# Patient Record
Sex: Female | Born: 1985 | Hispanic: Yes | Marital: Single | Smoking: Never smoker
Health system: Southern US, Community
[De-identification: ages and names within clinical notes are randomized; demographics above are authoritative.]

---

## 2009-09-26 ENCOUNTER — Inpatient Hospital Stay (HOSPITAL_COMMUNITY): Admission: AD | Admit: 2009-09-26 | Discharge: 2009-09-28 | Payer: Self-pay | Admitting: Obstetrics and Gynecology

## 2010-03-15 ENCOUNTER — Emergency Department (HOSPITAL_BASED_OUTPATIENT_CLINIC_OR_DEPARTMENT_OTHER): Admission: EM | Admit: 2010-03-15 | Discharge: 2010-03-16 | Payer: Self-pay | Admitting: Emergency Medicine

## 2010-03-15 ENCOUNTER — Ambulatory Visit: Payer: Self-pay | Admitting: Diagnostic Radiology

## 2010-11-04 LAB — URINALYSIS, ROUTINE W REFLEX MICROSCOPIC
Bilirubin Urine: NEGATIVE
Hgb urine dipstick: NEGATIVE
Nitrite: NEGATIVE
pH: 5.5 (ref 5.0–8.0)

## 2010-11-04 LAB — PREGNANCY, URINE: Preg Test, Ur: NEGATIVE

## 2010-11-09 LAB — CBC
Hemoglobin: 11.8 g/dL — ABNORMAL LOW (ref 12.0–15.0)
Hemoglobin: 13.4 g/dL (ref 12.0–15.0)
MCHC: 34 g/dL (ref 30.0–36.0)
MCV: 96.1 fL (ref 78.0–100.0)
MCV: 96.2 fL (ref 78.0–100.0)
Platelets: 220 10*3/uL (ref 150–400)
RDW: 13 % (ref 11.5–15.5)

## 2010-11-09 LAB — RPR: RPR Ser Ql: NONREACTIVE

## 2014-05-21 ENCOUNTER — Encounter (HOSPITAL_COMMUNITY): Payer: Self-pay | Admitting: Emergency Medicine

## 2014-05-21 ENCOUNTER — Inpatient Hospital Stay (HOSPITAL_COMMUNITY)
Admission: EM | Admit: 2014-05-21 | Discharge: 2014-05-24 | DRG: 419 | Disposition: A | Discharge status: Home / Self Care | Payer: Self-pay | Attending: Surgery | Admitting: Surgery

## 2014-05-21 ENCOUNTER — Emergency Department (HOSPITAL_COMMUNITY): Payer: Self-pay

## 2014-05-21 DIAGNOSIS — K810 Acute cholecystitis: Secondary | ICD-10-CM

## 2014-05-21 DIAGNOSIS — K8000 Calculus of gallbladder with acute cholecystitis without obstruction: Principal | ICD-10-CM | POA: Diagnosis present

## 2014-05-21 DIAGNOSIS — Z79899 Other long term (current) drug therapy: Secondary | ICD-10-CM

## 2014-05-21 LAB — COMPREHENSIVE METABOLIC PANEL
ALT: 33 U/L (ref 0–35)
AST: 20 U/L (ref 0–37)
Albumin: 4 g/dL (ref 3.5–5.2)
Alkaline Phosphatase: 49 U/L (ref 39–117)
Anion gap: 14 (ref 5–15)
BILIRUBIN TOTAL: 1.6 mg/dL — AB (ref 0.3–1.2)
BUN: 11 mg/dL (ref 6–23)
CALCIUM: 9.1 mg/dL (ref 8.4–10.5)
CO2: 22 mEq/L (ref 19–32)
Chloride: 101 mEq/L (ref 96–112)
Creatinine, Ser: 0.69 mg/dL (ref 0.50–1.10)
GFR calc non Af Amer: >90 mL/min (ref >90)
GLUCOSE: 123 mg/dL — AB (ref 70–99)
Potassium: 3.7 mEq/L (ref 3.7–5.3)
Sodium: 137 mEq/L (ref 137–147)
TOTAL PROTEIN: 7.9 g/dL (ref 6.0–8.3)

## 2014-05-21 LAB — CBC WITH DIFFERENTIAL/PLATELET
BASOS ABS: 0 10*3/uL (ref 0.0–0.1)
BASOS PCT: 0 % (ref 0–1)
EOS ABS: 0 10*3/uL (ref 0.0–0.7)
Eosinophils Relative: 0 % (ref 0–5)
HCT: 37.5 % (ref 36.0–46.0)
Hemoglobin: 13.4 g/dL (ref 12.0–15.0)
LYMPHS PCT: 7 % — AB (ref 12–46)
Lymphs Abs: 1.1 10*3/uL (ref 0.7–4.0)
MCH: 31.5 pg (ref 26.0–34.0)
MCHC: 35.7 g/dL (ref 30.0–36.0)
MCV: 88 fL (ref 78.0–100.0)
MONOS PCT: 12 % (ref 3–12)
Monocytes Absolute: 2 10*3/uL — ABNORMAL HIGH (ref 0.1–1.0)
NEUTROS ABS: 12.6 10*3/uL — AB (ref 1.7–7.7)
NEUTROS PCT: 81 % — AB (ref 43–77)
Platelets: 278 10*3/uL (ref 150–400)
RBC: 4.26 MIL/uL (ref 3.87–5.11)
RDW: 12.5 % (ref 11.5–15.5)
WBC: 15.7 10*3/uL — AB (ref 4.0–10.5)

## 2014-05-21 LAB — POC URINE PREG, ED: PREG TEST UR: NEGATIVE

## 2014-05-21 LAB — URINE MICROSCOPIC-ADD ON

## 2014-05-21 LAB — URINALYSIS, ROUTINE W REFLEX MICROSCOPIC
Bilirubin Urine: NEGATIVE
Glucose, UA: NEGATIVE mg/dL
Ketones, ur: 40 mg/dL — AB
NITRITE: NEGATIVE
Protein, ur: NEGATIVE mg/dL
Specific Gravity, Urine: 1.005 (ref 1.005–1.030)
UROBILINOGEN UA: 0.2 mg/dL (ref 0.0–1.0)
pH: 6 (ref 5.0–8.0)

## 2014-05-21 LAB — LIPASE, BLOOD: Lipase: 15 U/L (ref 11–59)

## 2014-05-21 MED ORDER — MORPHINE SULFATE 2 MG/ML IJ SOLN
2.0000 mg | INTRAMUSCULAR | Status: DC | PRN
  Administered 2014-05-21: 2 mg via INTRAVENOUS
  Filled 2014-05-21: qty 2

## 2014-05-21 MED ORDER — SODIUM CHLORIDE 0.9 % IV SOLN
INTRAVENOUS | Status: DC
  Administered 2014-05-21 – 2014-05-22 (×4): via INTRAVENOUS
  Administered 2014-05-23: 100 mL/h via INTRAVENOUS
  Administered 2014-05-24: 01:00:00 via INTRAVENOUS

## 2014-05-21 MED ORDER — ONDANSETRON HCL 4 MG/2ML IJ SOLN
4.0000 mg | Freq: Once | INTRAMUSCULAR | Status: AC
  Administered 2014-05-21: 4 mg via INTRAVENOUS
  Filled 2014-05-21: qty 2

## 2014-05-21 MED ORDER — HYDROMORPHONE HCL 1 MG/ML IJ SOLN
1.0000 mg | Freq: Once | INTRAMUSCULAR | Status: AC
  Administered 2014-05-21: 1 mg via INTRAVENOUS
  Filled 2014-05-21: qty 1

## 2014-05-21 MED ORDER — CEFTRIAXONE SODIUM 2 G IJ SOLR
2.0000 g | INTRAMUSCULAR | Status: DC
  Administered 2014-05-21 – 2014-05-23 (×3): 2 g via INTRAVENOUS
  Filled 2014-05-21 (×4): qty 2

## 2014-05-21 MED ORDER — SODIUM CHLORIDE 0.9 % IV BOLUS (SEPSIS)
1000.0000 mL | Freq: Once | INTRAVENOUS | Status: AC
  Administered 2014-05-21: 1000 mL via INTRAVENOUS

## 2014-05-21 MED ORDER — ONDANSETRON HCL 4 MG/2ML IJ SOLN: 4.0000 mg | Freq: Four times a day (QID) | INTRAMUSCULAR | Status: DC | PRN

## 2014-05-21 MED ORDER — ENOXAPARIN SODIUM 40 MG/0.4ML ~~LOC~~ SOLN
40.0000 mg | SUBCUTANEOUS | Status: DC
  Administered 2014-05-21 – 2014-05-23 (×3): 40 mg via SUBCUTANEOUS
  Filled 2014-05-21 (×4): qty 0.4

## 2014-05-21 MED ORDER — ACETAMINOPHEN 650 MG RE SUPP: 650.0000 mg | Freq: Four times a day (QID) | RECTAL | Status: DC | PRN

## 2014-05-21 MED ORDER — MORPHINE SULFATE 4 MG/ML IJ SOLN
4.0000 mg | Freq: Once | INTRAMUSCULAR | Status: AC
  Administered 2014-05-21: 4 mg via INTRAVENOUS
  Filled 2014-05-21: qty 1

## 2014-05-21 MED ORDER — ACETAMINOPHEN 325 MG PO TABS
650.0000 mg | ORAL_TABLET | Freq: Four times a day (QID) | ORAL | Status: DC | PRN
  Administered 2014-05-22: 650 mg via ORAL
  Filled 2014-05-21: qty 2

## 2014-05-21 MED ORDER — HYDROCODONE-ACETAMINOPHEN 5-325 MG PO TABS
1.0000 | ORAL_TABLET | ORAL | Status: DC | PRN
  Administered 2014-05-22 – 2014-05-24 (×7): 2 via ORAL
  Filled 2014-05-21 (×7): qty 2

## 2014-05-21 NOTE — Progress Notes (Signed)
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

## 2014-05-21 NOTE — ED Notes (Signed)
Will get urine sample once Katherine Berry is done with her

## 2014-05-21 NOTE — H&P (Signed)
I saw the patient, participated in the history, exam and medical decision making, and concur with the physician assistant's note above.  Several days of epigastric and Rt sided pain. Some n and v. Subjective f/c. ?prior episodes years ago Alert, nontoxic cta b/l Soft, nd, +RUQ TTP, +murphy's sign. No rt/guarding/peritonitis  Acute calculous cholecystitis  Very large GS on u/s which may make Lap chole challenging and higher risk for open procedure. Admit  Clears, npo p mn IV abx for cholecystitis.  Tentative cholecystectomy Saturday by my partners on call this weekend; however, they will make final call regarding timing of surgery and whether or not addl testing needs to be done.  Repeat labs in am  Mary Sellaric M. Andrey CampanileWilson, MD, FACS General, Bariatric, & Minimally Invasive Surgery [facility], [place]

## 2014-05-21 NOTE — H&P (Signed)
Chief Complaint: acute calculous cholecystitis  HPI: Katherine Berry is a healthy 28 year old Hispanic female who presents with abdominal pain.  Duration of symptoms is 3 days. Coarse is worsening. Time pattern is constant.  Severe in severity.  Location is RUQ, epigastric region with radiation to the back.  Aggravated by food, moving.  No alleviating factors.  No modifying factors. Associated with nausea, vomiting, fevers, chills.  Denies diarrhea, constipation, melena, hematochezia or dysuria.  She has not other medical problems, no previous surgeries. She reports similar symptoms about 11 years ago.  Her work up shows a white count of 15.7K. Bilirubin of 1.6, normal AST and ALT.  [place] of abdomen showed a 2.47m CBD, 6cm gallstone, sludge, positive sonographic murphy's sign, impacted stone at the neck of the gallbladder.  We have therefore been asked to evaluate the patient.   History reviewed. No pertinent past medical history.  History reviewed. No pertinent past surgical history.  History reviewed. No pertinent family history. Social History:  reports that she has never smoked. She has never used smokeless tobacco. She reports that she does not drink alcohol or use illicit drugs.  Allergies: No Known Allergies   Medication Prior to Admission medications   Medication Sig Start Date End Date Taking? Authorizing Provider  metroNIDAZOLE (FLAGYL) 500 MG tablet Take 500 mg by mouth 3 (three) times daily.   Yes Historical Provider, MD  omeprazole (PRILOSEC) 40 MG capsule Take 40 mg by mouth daily.   Yes Historical Provider, MD  promethazine (PHENERGAN) 25 MG tablet Take 25 mg by mouth every 6 (six) hours as needed for nausea or vomiting.   Yes Historical Provider, MD     (Not in a hospital admission)  Results for orders placed during the hospital encounter of 05/21/14 (from the past 48 hour(s))  CBC WITH DIFFERENTIAL     Status: Abnormal   Collection Time    05/21/14  2:16 PM      Result Value  Ref Range   WBC 15.7 (*) 4.0 - 10.5 K/uL   RBC 4.26  3.87 - 5.11 MIL/uL   Hemoglobin 13.4  12.0 - 15.0 g/dL   HCT 37.5  36.0 - 46.0 %   MCV 88.0  78.0 - 100.0 fL   MCH 31.5  26.0 - 34.0 pg   MCHC 35.7  30.0 - 36.0 g/dL   RDW 12.5  11.5 - 15.5 %   Platelets 278  150 - 400 K/uL   Neutrophils Relative % 81 (*) 43 - 77 %   Neutro Abs 12.6 (*) 1.7 - 7.7 K/uL   Lymphocytes Relative 7 (*) 12 - 46 %   Lymphs Abs 1.1  0.7 - 4.0 K/uL   Monocytes Relative 12  3 - 12 %   Monocytes Absolute 2.0 (*) 0.1 - 1.0 K/uL   Eosinophils Relative 0  0 - 5 %   Eosinophils Absolute 0.0  0.0 - 0.7 K/uL   Basophils Relative 0  0 - 1 %   Basophils Absolute 0.0  0.0 - 0.1 K/uL  COMPREHENSIVE METABOLIC PANEL     Status: Abnormal   Collection Time    05/21/14  2:16 PM      Result Value Ref Range   Sodium 137  137 - 147 mEq/L   Potassium 3.7  3.7 - 5.3 mEq/L   Chloride 101  96 - 112 mEq/L   CO2 22  19 - 32 mEq/L   Glucose, Bld 123 (*) 70 - 99 mg/dL  BUN 11  6 - 23 mg/dL   Creatinine, Ser 0.69  0.50 - 1.10 mg/dL   Calcium 9.1  8.4 - 10.5 mg/dL   Total Protein 7.9  6.0 - 8.3 g/dL   Albumin 4.0  3.5 - 5.2 g/dL   AST 20  0 - 37 U/L   ALT 33  0 - 35 U/L   Alkaline Phosphatase 49  39 - 117 U/L   Total Bilirubin 1.6 (*) 0.3 - 1.2 mg/dL   GFR calc non Af Amer >90  >90 mL/min   GFR calc Af Amer >90  >90 mL/min   Comment: (NOTE)     The eGFR has been calculated using the CKD EPI equation.     This calculation has not been validated in all clinical situations.     eGFR's persistently <90 mL/min signify possible Chronic Kidney     Disease.   Anion gap 14  5 - 15  LIPASE, BLOOD     Status: None   Collection Time    05/21/14  2:16 PM      Result Value Ref Range   Lipase 15  11 - 59 U/L  POC URINE PREG, ED     Status: None   Collection Time    05/21/14  3:40 PM      Result Value Ref Range   Preg Test, Ur NEGATIVE  NEGATIVE   Comment:            THE SENSITIVITY OF THIS     METHODOLOGY IS >24 mIU/mL   US  Abdomen Limited  05/21/2014   CLINICAL DATA:  Three-day history of right-sided abdominal pain with nausea  EXAM: US ABDOMEN LIMITED - RIGHT UPPER QUADRANT  COMPARISON:  None  FINDINGS: Gallbladder:  The gallbladder is adequately distended but contains a large stone impacted in the neck. This stone measures at least 6 cm diameter. There is sludge within the gallbladder. There is a positive sonographic Murphy's sign. There is gallbladder wall thickening to nearly 14 mm. There is no definite pericholecystic fluid.  Common bile duct:  Diameter: 2.9 mm.  Liver:  The liver exhibits normal echotexture with no focal mass nor ductal dilation.  IMPRESSION: Acute cholelithiasis with acute cholecystitis. The common bile duct and liver are unremarkable.   Electronically Signed   By: David  [place]   On: 05/21/2014 15:16    Review of Systems  All other systems reviewed and are negative.   Blood pressure 119/72, pulse 96, temperature 100.3 F (37.9 C), temperature source Rectal, resp. rate 17, weight 152 lb (68.947 kg), SpO2 100.00%. Physical Exam  Constitutional: She is oriented to person, place, and time. She appears well-developed and well-nourished. No distress.  HENT:  Head: Normocephalic and atraumatic.  Eyes: Right eye exhibits no discharge. Left eye exhibits no discharge. No scleral icterus.  Cardiovascular: Normal rate, regular rhythm, normal heart sounds and intact distal pulses.  Exam reveals no gallop and no friction rub.   No murmur heard. Respiratory: Effort normal and breath sounds normal. No respiratory distress. She has no wheezes. She has no rales. She exhibits no tenderness.  GI: Soft. Bowel sounds are normal. She exhibits no distension and no mass. There is no rebound.  Positive murphy's sign  Musculoskeletal: Normal range of motion. She exhibits no edema and no tenderness.  Neurological: She is alert and oriented to person, place, and time.  Skin: Skin is warm and dry. No rash noted. She  is not diaphoretic. No erythema. No  pallor.  Psychiatric: She has a normal mood and affect. Her behavior is normal. Judgment and thought content normal.     Assessment/Plan Acute calculous cholecystitis Elevated T bilirubin Leukocytosis   Admit for IV antibiotics, pain control May have clears, NPO after midnight Will likely need a cholecystectomy.  I discussed with her the risks of the surgery via interpreter including but not limited to infection, bleeding, injury to surrounding structures and anesthesia risk.  She does speak some english.  She understands plans may change tomorrow pending MD evaluation.  Repeat her labs in AM IVF Pain control SCD/lovenox   Katherine Berry ANP-C 05/21/2014, 4:16 PM

## 2014-05-21 NOTE — ED Notes (Addendum)
Pt reports abdominal pain with tender to RUQ and n/v for 3 days. Pt reports normal BM yesterday. Pt denies CP or SOB.

## 2014-05-21 NOTE — ED Notes (Addendum)
Pt reports: 3 day hx of N/V, stabbing back pain, increasing lower abdominal pain radiating to upper abdomin.Fever last night Seen at clinic yesterday. Rx did not decreased pain or nausea, vomited this am. Pt having difficulty walking due to severe pain. Denies burning on urination

## 2014-05-21 NOTE — ED Notes (Signed)
US at bedside

## 2014-05-21 NOTE — ED Provider Notes (Signed)
CSN: [id number]     Arrival date & time 05/21/14  1340 History   First MD Initiated Contact with Patient 05/21/14 1422     Chief Complaint  Patient presents with  . Abdominal Pain    pain radiating across uper and lower abdomin  . Back Pain    upper back "stabbing pain"  . Nausea  . Emesis     (Consider location/radiation/quality/duration/timing/severity/associated sxs/prior Treatment) HPI 28 year old female presents with sharp right upper quadrant abdominal pain over the last 3 days. It is mostly a constant pain. Has had nausea, vomiting, and radiation of pain up into the chest and back. Has had a fever starting yesterday but has not measured her temperature. Denies any urinary symptoms. Denies any prior pain like this before. The pain is currently rated as severe. Denies any diarrhea last had a normal bowel movement today.  History reviewed. No pertinent past medical history. History reviewed. No pertinent past surgical history. History reviewed. No pertinent family history. History  Substance Use Topics  . Smoking status: Never Smoker   . Smokeless tobacco: Not on file  . Alcohol Use: No   OB History   Grav Para Term Preterm Abortions TAB SAB Ect Mult Living                 Review of Systems  Constitutional: Positive for fever.  Cardiovascular: Positive for chest pain.  Gastrointestinal: Positive for nausea, vomiting and abdominal pain. Negative for diarrhea and constipation.  Genitourinary: Negative for dysuria.  Musculoskeletal: Positive for back pain.  All other systems reviewed and are negative.     Allergies  Review of patient's allergies indicates no known allergies.  Home Medications   Prior to Admission medications   Medication Sig Start Date End Date Taking? Authorizing Provider  metroNIDAZOLE (FLAGYL) 500 MG tablet Take 500 mg by mouth 3 (three) times daily.   Yes Historical Provider, MD  omeprazole (PRILOSEC) 40 MG capsule Take 40 mg by mouth daily.    Yes Historical Provider, MD  promethazine (PHENERGAN) 25 MG tablet Take 25 mg by mouth every 6 (six) hours as needed for nausea or vomiting.   Yes Historical Provider, MD   BP 117/92  Pulse 107  Temp(Src) 99 F (37.2 C) (Oral)  Resp 18  Wt 152 lb (68.947 kg)  SpO2 98% Physical Exam  Nursing note and vitals reviewed. Constitutional: She is oriented to person, place, and time. She appears well-developed and well-nourished.  Appears uncomfortable  HENT:  Head: Normocephalic and atraumatic.  Right Ear: External ear normal.  Left Ear: External ear normal.  Nose: Nose normal.  Eyes: Right eye exhibits no discharge. Left eye exhibits no discharge.  Cardiovascular: Regular rhythm and normal heart sounds.  Tachycardia present.   Pulmonary/Chest: Effort normal and breath sounds normal.  Abdominal: Soft. There is tenderness in the right upper quadrant. There is positive Murphy's sign.  Neurological: She is alert and oriented to person, place, and time.  Skin: Skin is warm and dry.    ED Course  Procedures (including critical care time) Labs Review Labs Reviewed  CBC WITH DIFFERENTIAL - Abnormal; Notable for the following:    WBC 15.7 (*)    Neutrophils Relative % 81 (*)    Neutro Abs 12.6 (*)    Lymphocytes Relative 7 (*)    Monocytes Absolute 2.0 (*)    All other components within normal limits  COMPREHENSIVE METABOLIC PANEL - Abnormal; Notable for the following:    Glucose, Bld 123 (*)  Total Bilirubin 1.6 (*)    All other components within normal limits  LIPASE, BLOOD  URINALYSIS, ROUTINE W REFLEX MICROSCOPIC  POC URINE PREG, ED    Imaging Review Kore[place] Abdomen Limited  05/21/2014   CLINICAL DATA:  Three-day history of right-sided abdominal pain with nausea  EXAM: US ABDOMEN LIMITED - RIGHT UPPER QUADRANT  COMPARISON:  None  FINDINGS: Gallbladder:  The gallbladder is adequately distended but contains a large stone impacted in the neck. This stone measures at least 6 cm  diameter. There is sludge within the gallbladder. There is a positive sonographic Murphy's sign. There is gallbladder wall thickening to nearly 14 mm. There is no definite pericholecystic fluid.  Common bile duct:  Diameter: 2.9 mm.  Liver:  The liver exhibits normal echotexture with no focal mass nor ductal dilation.  IMPRESSION: Acute cholelithiasis with acute cholecystitis. The common bile duct and liver are unremarkable.   Electronically Signed   By: David  [place]   On: 05/21/2014 15:16     EKG Interpretation None      MDM   Final diagnoses:  Acute cholecystitis    Patient's workup shows acute cholecystitis with a large gallstone at the neck. Patient's heart rate improved with fluids but pain was difficult to control. Discussed with surgery, they will admit. Asked about antibiotics but they asked to hold and they will place admission orders with antibiotics involved.    Audree CamelScott T Shamaine Mulkern, MD 05/21/14 [phone]

## 2014-05-22 ENCOUNTER — Observation Stay (HOSPITAL_COMMUNITY): Payer: Self-pay

## 2014-05-22 ENCOUNTER — Observation Stay (HOSPITAL_COMMUNITY): Payer: Self-pay | Admitting: *Deleted

## 2014-05-22 ENCOUNTER — Encounter (HOSPITAL_COMMUNITY): Payer: Self-pay | Admitting: *Deleted

## 2014-05-22 ENCOUNTER — Encounter (HOSPITAL_COMMUNITY): Admission: EM | Disposition: A | Discharge status: Home / Self Care | Payer: Self-pay | Source: Home / Self Care

## 2014-05-22 HISTORY — PX: CHOLECYSTECTOMY: SHX55

## 2014-05-22 LAB — COMPREHENSIVE METABOLIC PANEL
ALT: 29 U/L (ref 0–35)
AST: 20 U/L (ref 0–37)
Albumin: 3 g/dL — ABNORMAL LOW (ref 3.5–5.2)
Alkaline Phosphatase: 48 U/L (ref 39–117)
Anion gap: 8 (ref 5–15)
BILIRUBIN TOTAL: 1.6 mg/dL — AB (ref 0.3–1.2)
BUN: 7 mg/dL (ref 6–23)
CHLORIDE: 103 meq/L (ref 96–112)
CO2: 26 meq/L (ref 19–32)
Calcium: 8.2 mg/dL — ABNORMAL LOW (ref 8.4–10.5)
Creatinine, Ser: 0.71 mg/dL (ref 0.50–1.10)
Glucose, Bld: 106 mg/dL — ABNORMAL HIGH (ref 70–99)
Potassium: 3.7 mEq/L (ref 3.7–5.3)
Sodium: 137 mEq/L (ref 137–147)
Total Protein: 6.4 g/dL (ref 6.0–8.3)

## 2014-05-22 LAB — SURGICAL PCR SCREEN
MRSA, PCR: NEGATIVE
Staphylococcus aureus: NEGATIVE

## 2014-05-22 LAB — CBC
HEMATOCRIT: 32.2 % — AB (ref 36.0–46.0)
Hemoglobin: 11.3 g/dL — ABNORMAL LOW (ref 12.0–15.0)
MCH: 31.4 pg (ref 26.0–34.0)
MCHC: 35.1 g/dL (ref 30.0–36.0)
MCV: 89.4 fL (ref 78.0–100.0)
Platelets: 228 10*3/uL (ref 150–400)
RBC: 3.6 MIL/uL — AB (ref 3.87–5.11)
RDW: 12.7 % (ref 11.5–15.5)
WBC: 10.5 10*3/uL (ref 4.0–10.5)

## 2014-05-22 SURGERY — LAPAROSCOPIC CHOLECYSTECTOMY WITH INTRAOPERATIVE CHOLANGIOGRAM
Anesthesia: General | Site: Abdomen

## 2014-05-22 MED ORDER — DEXAMETHASONE SODIUM PHOSPHATE 10 MG/ML IJ SOLN
INTRAMUSCULAR | Status: DC | PRN
  Administered 2014-05-22: 10 mg via INTRAVENOUS

## 2014-05-22 MED ORDER — ONDANSETRON HCL 4 MG/2ML IJ SOLN
INTRAMUSCULAR | Status: DC | PRN
  Administered 2014-05-22: 4 mg via INTRAVENOUS

## 2014-05-22 MED ORDER — KETOROLAC TROMETHAMINE 30 MG/ML IJ SOLN
15.0000 mg | Freq: Once | INTRAMUSCULAR | Status: AC | PRN
  Administered 2014-05-22: 30 mg via INTRAVENOUS

## 2014-05-22 MED ORDER — MIDAZOLAM HCL 2 MG/2ML IJ SOLN: 0.5000 mg | Freq: Once | INTRAMUSCULAR | Status: DC | PRN

## 2014-05-22 MED ORDER — HYDROMORPHONE HCL 1 MG/ML IJ SOLN
INTRAMUSCULAR | Status: DC | PRN
  Administered 2014-05-22 (×2): 1 mg via INTRAVENOUS

## 2014-05-22 MED ORDER — LIP MEDEX EX OINT
TOPICAL_OINTMENT | CUTANEOUS | Status: AC
  Filled 2014-05-22: qty 7

## 2014-05-22 MED ORDER — NEOSTIGMINE METHYLSULFATE 10 MG/10ML IV SOLN
INTRAVENOUS | Status: DC | PRN
  Administered 2014-05-22: 5 mg via INTRAVENOUS

## 2014-05-22 MED ORDER — LACTATED RINGERS IR SOLN
Status: DC | PRN
  Administered 2014-05-22: 1

## 2014-05-22 MED ORDER — BUPIVACAINE HCL (PF) 0.5 % IJ SOLN
INTRAMUSCULAR | Status: DC | PRN
  Administered 2014-05-22: 20 mL

## 2014-05-22 MED ORDER — BUPIVACAINE HCL (PF) 0.5 % IJ SOLN
INTRAMUSCULAR | Status: AC
  Filled 2014-05-22: qty 30

## 2014-05-22 MED ORDER — MIDAZOLAM HCL 5 MG/5ML IJ SOLN
INTRAMUSCULAR | Status: DC | PRN
  Administered 2014-05-22: 2 mg via INTRAVENOUS

## 2014-05-22 MED ORDER — SUCCINYLCHOLINE CHLORIDE 20 MG/ML IJ SOLN
INTRAMUSCULAR | Status: DC | PRN
  Administered 2014-05-22: 100 mg via INTRAVENOUS

## 2014-05-22 MED ORDER — LACTATED RINGERS IV SOLN
INTRAVENOUS | Status: DC | PRN
  Administered 2014-05-22 (×2): via INTRAVENOUS

## 2014-05-22 MED ORDER — KETOROLAC TROMETHAMINE 30 MG/ML IJ SOLN
INTRAMUSCULAR | Status: AC
  Filled 2014-05-22: qty 1

## 2014-05-22 MED ORDER — MORPHINE SULFATE 2 MG/ML IJ SOLN
2.0000 mg | INTRAMUSCULAR | Status: DC | PRN
Start: 2014-05-22 — End: 2014-05-24
  Administered 2014-05-22: 2 mg via INTRAVENOUS
  Filled 2014-05-22: qty 1

## 2014-05-22 MED ORDER — MEPERIDINE HCL 50 MG/ML IJ SOLN: 6.2500 mg | INTRAMUSCULAR | Status: DC | PRN

## 2014-05-22 MED ORDER — 0.9 % SODIUM CHLORIDE (POUR BTL) OPTIME
TOPICAL | Status: DC | PRN
  Administered 2014-05-22: 1000 mL

## 2014-05-22 MED ORDER — GLYCOPYRROLATE 0.2 MG/ML IJ SOLN
INTRAMUSCULAR | Status: DC | PRN
  Administered 2014-05-22: 1 mg via INTRAVENOUS

## 2014-05-22 MED ORDER — SCOPOLAMINE 1 MG/3DAYS TD PT72
MEDICATED_PATCH | TRANSDERMAL | Status: DC | PRN
  Administered 2014-05-22: 1 via TRANSDERMAL

## 2014-05-22 MED ORDER — SCOPOLAMINE 1 MG/3DAYS TD PT72
MEDICATED_PATCH | TRANSDERMAL | Status: AC
  Filled 2014-05-22: qty 1

## 2014-05-22 MED ORDER — CEFAZOLIN SODIUM-DEXTROSE 2-3 GM-% IV SOLR
INTRAVENOUS | Status: DC | PRN
  Administered 2014-05-22: 2 g via INTRAVENOUS

## 2014-05-22 MED ORDER — ROCURONIUM BROMIDE 100 MG/10ML IV SOLN
INTRAVENOUS | Status: DC | PRN
  Administered 2014-05-22: 5 mg via INTRAVENOUS
  Administered 2014-05-22: 10 mg via INTRAVENOUS
  Administered 2014-05-22: 20 mg via INTRAVENOUS

## 2014-05-22 MED ORDER — HYDROMORPHONE HCL 1 MG/ML IJ SOLN: 0.2500 mg | INTRAMUSCULAR | Status: DC | PRN

## 2014-05-22 MED ORDER — FENTANYL CITRATE 0.05 MG/ML IJ SOLN
INTRAMUSCULAR | Status: DC | PRN
  Administered 2014-05-22: 100 ug via INTRAVENOUS
  Administered 2014-05-22: 50 ug via INTRAVENOUS
  Administered 2014-05-22: 100 ug via INTRAVENOUS

## 2014-05-22 MED ORDER — MIDAZOLAM HCL 2 MG/2ML IJ SOLN
INTRAMUSCULAR | Status: AC
  Filled 2014-05-22: qty 2

## 2014-05-22 MED ORDER — CEFAZOLIN SODIUM-DEXTROSE 2-3 GM-% IV SOLR
INTRAVENOUS | Status: AC
  Filled 2014-05-22: qty 50

## 2014-05-22 MED ORDER — LIDOCAINE HCL (CARDIAC) 20 MG/ML IV SOLN
INTRAVENOUS | Status: DC | PRN
  Administered 2014-05-22: 100 mg via INTRAVENOUS

## 2014-05-22 MED ORDER — PROMETHAZINE HCL 25 MG/ML IJ SOLN: 6.2500 mg | INTRAMUSCULAR | Status: DC | PRN

## 2014-05-22 MED ORDER — PROPOFOL 10 MG/ML IV BOLUS
INTRAVENOUS | Status: AC
  Filled 2014-05-22: qty 20

## 2014-05-22 MED ORDER — PROPOFOL 10 MG/ML IV BOLUS
INTRAVENOUS | Status: DC | PRN
  Administered 2014-05-22: 150 mg via INTRAVENOUS

## 2014-05-22 MED ORDER — HYDROMORPHONE HCL 2 MG/ML IJ SOLN
INTRAMUSCULAR | Status: AC
  Filled 2014-05-22: qty 1

## 2014-05-22 MED ORDER — FENTANYL CITRATE 0.05 MG/ML IJ SOLN
INTRAMUSCULAR | Status: AC
  Filled 2014-05-22: qty 5

## 2014-05-22 SURGICAL SUPPLY — 34 items
APPLIER CLIP 5 13 M/L LIGAMAX5 (MISCELLANEOUS) ×6
BANDAGE ADH SHEER 1  50/CT (GAUZE/BANDAGES/DRESSINGS) ×3 IMPLANT
BENZOIN TINCTURE PRP APPL 2/3 (GAUZE/BANDAGES/DRESSINGS) ×3 IMPLANT
CHLORAPREP W/TINT 26ML (MISCELLANEOUS) ×3 IMPLANT
CLIP APPLIE 5 13 M/L LIGAMAX5 (MISCELLANEOUS) ×2 IMPLANT
CLOSURE STERI-STRIP 1/4X4 (GAUZE/BANDAGES/DRESSINGS) ×3 IMPLANT
CLOSURE WOUND 1/2 X4 (GAUZE/BANDAGES/DRESSINGS) ×1
COVER MAYO STAND STRL (DRAPES) ×3 IMPLANT
DECANTER SPIKE VIAL GLASS SM (MISCELLANEOUS) IMPLANT
DRAPE C-ARM 42X120 X-RAY (DRAPES) ×3 IMPLANT
DRAPE LAPAROSCOPIC ABDOMINAL (DRAPES) ×3 IMPLANT
ELECT REM PT RETURN 9FT ADLT (ELECTROSURGICAL) ×3
ELECTRODE REM PT RTRN 9FT ADLT (ELECTROSURGICAL) ×1 IMPLANT
GLOVE SURG SIGNA 7.5 PF LTX (GLOVE) ×3 IMPLANT
GOWN STRL REUS W/TWL XL LVL3 (GOWN DISPOSABLE) ×6 IMPLANT
HEMOSTAT SNOW SURGICEL 2X4 (HEMOSTASIS) ×3 IMPLANT
HEMOSTAT SURGICEL 4X8 (HEMOSTASIS) IMPLANT
KIT BASIN OR (CUSTOM PROCEDURE TRAY) ×3 IMPLANT
POUCH RETRIEVAL ECOSAC 10 (ENDOMECHANICALS) ×1 IMPLANT
POUCH RETRIEVAL ECOSAC 10MM (ENDOMECHANICALS) ×2
POUCH SPECIMEN RETRIEVAL 10MM (ENDOMECHANICALS) ×3 IMPLANT
SCISSORS LAP 5X35 DISP (ENDOMECHANICALS) ×3 IMPLANT
SET CHOLANGIOGRAPH MIX (MISCELLANEOUS) ×3 IMPLANT
SET IRRIG TUBING LAPAROSCOPIC (IRRIGATION / IRRIGATOR) ×3 IMPLANT
SLEEVE XCEL OPT CAN 5 100 (ENDOMECHANICALS) ×6 IMPLANT
SOLUTION ANTI FOG 6CC (MISCELLANEOUS) IMPLANT
STRIP CLOSURE SKIN 1/2X4 (GAUZE/BANDAGES/DRESSINGS) ×2 IMPLANT
SUT MNCRL AB 4-0 PS2 18 (SUTURE) ×3 IMPLANT
TOWEL OR 17X26 10 PK STRL BLUE (TOWEL DISPOSABLE) ×3 IMPLANT
TOWEL OR NON WOVEN STRL DISP B (DISPOSABLE) ×3 IMPLANT
TRAY LAP CHOLE (CUSTOM PROCEDURE TRAY) ×3 IMPLANT
TROCAR BLADELESS OPT 5 100 (ENDOMECHANICALS) ×3 IMPLANT
TROCAR XCEL BLUNT TIP 100MML (ENDOMECHANICALS) ×3 IMPLANT
TUBING INSUFFLATION 10FT LAP (TUBING) ×3 IMPLANT

## 2014-05-22 NOTE — Transfer of Care (Signed)
Immediate Anesthesia Transfer of Care Note  Patient: Katherine Berry  Procedure(s) Performed: Procedure(s): LAPAROSCOPIC CHOLECYSTECTOMY (N/A)  Patient Location: PACU  Anesthesia Type:General  Level of Consciousness: awake and sedated  Airway & Oxygen Therapy: Patient Spontanous Breathing and Patient connected to face mask oxygen  Post-op Assessment: Report given to PACU RN and Post -op Vital signs reviewed and stable  Post vital signs: Reviewed and stable  Complications: No apparent anesthesia complications

## 2014-05-22 NOTE — Anesthesia Preprocedure Evaluation (Signed)
Anesthesia Evaluation  Patient identified by MRN, date of birth, ID band Patient awake    Reviewed: Allergy & Precautions, H&P , Patient's Chart, lab work & pertinent test results, reviewed documented beta blocker date and time   History of Anesthesia Complications Negative for: history of anesthetic complications  Airway Mallampati: II TM Distance: >3 FB Neck ROM: full    Dental   Pulmonary  breath sounds clear to auscultation        Cardiovascular Exercise Tolerance: Good Rhythm:regular Rate:Normal     Neuro/Psych    GI/Hepatic   Endo/Other    Renal/GU      Musculoskeletal   Abdominal   Peds  Hematology   Anesthesia Other Findings   Reproductive/Obstetrics                           Anesthesia Physical Anesthesia Plan  ASA: II  Anesthesia Plan: General ETT   Post-op Pain Management:    Induction: Rapid sequence, Cricoid pressure planned and Intravenous  Airway Management Planned: Oral ETT  Additional Equipment:   Intra-op Plan:   Post-operative Plan:   Informed Consent: I have reviewed the patients History and Physical, chart, labs and discussed the procedure including the risks, benefits and alternatives for the proposed anesthesia with the patient or authorized representative who has indicated his/her understanding and acceptance.   Dental Advisory Given  Plan Discussed with: CRNA and Surgeon  Anesthesia Plan Comments:         Anesthesia Quick Evaluation  

## 2014-05-22 NOTE — Progress Notes (Signed)
pacu nursing - partial upper denture returned to patient, she placed in her mouth

## 2014-05-22 NOTE — Progress Notes (Signed)
UR completed 

## 2014-05-22 NOTE — Progress Notes (Signed)
Surgical consent and blood consent signed with help of pacific interpreter. Patient and husband able to voice what surgery, why she is having it and agreement for blood if needed. Given ample time for questions.

## 2014-05-22 NOTE — Op Note (Signed)
LAPAROSCOPIC CHOLECYSTECTOMY  Procedure Note  Katherine Berry 05/21/2014 - 05/22/2014   Pre-op Diagnosis: acute cholecystitis with cholelithiasis     Post-op Diagnosis: same  Procedure(s): LAPAROSCOPIC CHOLECYSTECTOMY  Surgeon(s): Abigail Miyamotoouglas Keigan Girten, MD Darnell Levelodd Gerkin, MD  Anesthesia: General  Staff:  Circulator: Jaynee EaglesAndrea C Broadnax, RN Radiology Technologist: Liam RogersShana Valerie Saganich Scrub Person: Susann GivensAngelia Simms; Riki RuskKatherine Jewel [place] Ranch[place], [place]  Estimated Blood Loss: Minimal               Specimens: sent to path          [facility]oscopy CenterBLACKMAN,Katherine Rayman A   Date: 05/22/2014  Time: 10:12 AM

## 2014-05-22 NOTE — Op Note (Signed)
NAMFanny Berry:  Ales, [name]               ACCOUNT NO.:  [id number]  MEDICAL RECORD NO.:  [id number]  LOCATION:  1530                         [facility]  PHYSICIAN:  Abigail Miyamotoouglas Alesia Oshields, M.D. DATE OF BIRTH:  [birth year]  DATE OF PROCEDURE:  05/22/2014 DATE OF DISCHARGE:                              OPERATIVE REPORT   PREOPERATIVE DIAGNOSIS:  Acute cholecystitis with cholelithiasis.  POSTOPERATIVE DIAGNOSIS:  Acute cholecystitis with cholelithiasis.  PROCEDURE:  Laparoscopic cholecystectomy.  SURGEON:  Abigail Miyamotoouglas Trinia Georgi, M.D.  ASSISTANT:  Velora Hecklerodd M Gerkin, MD  ANESTHESIA:  General endotracheal anesthesia.  ESTIMATED BLOOD LOSS:  Minimal.  FINDINGS:  The patient was found to have an acutely inflamed distended gallbladder with a large 6 cm stone impacted in the gallbladder neck.  PROCEDURE IN DETAIL:  The patient was brought to the operating room, identified as Ames Duraunia Atienza.  She was placed supine on the operating room table and general anesthesia was induced.  Her abdomen was then prepped and draped in the usual sterile fashion.  I made a vertical incision just below the umbilicus.  I carried this down to the fascia, which was opened with a scalpel.  A hemostat was then used to pass into the peritoneal cavity.  A 0 Vicryl pursestring suture was placed around the fascial opening.  The Hasson port was placed through the opening and insufflation of the abdomen was begun.  A 5-mm port was then placed in the patient's epigastrium and two more in the right upper quadrant all under direct vision.  The gallbladder was found to be acutely inflamed and distended.  A needle aspirated bile from the gallbladder in order to facilitate grasping the gallbladder.  It was then able to be grasped, retracted above the liver bed.  There was a very large stone impacted in the gallbladder neck.  When the gallbladder was retracted; however, I could easily identify the cystic duct.  A branch of the cystic  artery was anterior and I clipped this with clips proximally, distally, and transected.  The cystic duct was then easily identified and then a critical window was achieved around it.  I then clipped 4 times proximally, once distally, and transected.  Several branches of the cystic artery were then clipped with Surgiclip and cut as well.  The gallbladder was then slowly dissected free from the liver bed with the electrocautery.  Once it was free from liver bed, it was placed in a large endosac and removed the incision at the umbilicus.  The stone was so large that I had to open up the fascia and skin considerably at the umbilicus in order to facilitate delivery of the large gallbladder.  I then evaluated the liver bed and again hemostasis appeared to be achieved with the electrocautery as well as a piece of Surgicel SNoW. The abdomen was then copiously irrigated with normal saline.  All ports were removed under direct vision.  The abdomen was deflated.  I placed another figure-of-eight 0 Vicryl suture at the umbilicus and then closed the fascia completely with that suture and the pursestring suture.  I then anesthetized all the wounds with Marcaine and closed all incisions with 4-0 Monocryl subcuticular sutures.  Steri-Strips  and Band-Aids were then applied.  The patient tolerated the procedure well. All the counts were correct at the end of procedure.  The patient was then extubated in the operating room and taken in stable condition to recovery room.     Abigail Miyamoto, M.D.     DB/MEDQ  D:  05/22/2014  T:  05/22/2014  Job:  [id number]

## 2014-05-22 NOTE — Progress Notes (Signed)
  Subjective: Still with moderate pain  Objective: Vital signs in last 24 hours: Temp:  [99 F (37.2 C)-102.9 F (39.4 C)] 99.2 F (37.3 C) (10/03 0600) Pulse Rate:  [96-132] 99 (10/03 0600) Resp:  [16-22] 16 (10/03 0600) BP: (98-125)/(58-92) 98/58 mmHg (10/03 0600) SpO2:  [97 %-100 %] 98 % (10/03 0600) Weight:  [152 lb (68.947 kg)] 152 lb (68.947 kg) (10/02 1352) Last BM Date: 05/20/14  Intake/Output from previous day: 10/02 0701 - 10/03 0700 In: 1685 [P.O.:360; I.V.:1275; IV Piggyback:50] Out: -  Intake/Output this shift:    Uncomfortable in appearance Abdomen soft with RUQ tenderness  Lab Results:   Recent Labs  05/21/14 1416 05/22/14 0534  WBC 15.7* 10.5  HGB 13.4 11.3*  HCT 37.5 32.2*  PLT 278 228   BMET  Recent Labs  05/21/14 1416 05/22/14 0534  NA 137 137  K 3.7 3.7  CL 101 103  CO2 22 26  GLUCOSE 123* 106*  BUN 11 7  CREATININE 0.69 0.71  CALCIUM 9.1 8.2*   PT/INR No results found for this basename: LABPROT, INR,  in the last 72 hours ABG No results found for this basename: PHART, PCO2, PO2, HCO3,  in the last 72 hours  Studies/Results: [place] Abdomen Limited  05/21/2014   CLINICAL DATA:  Three-day history of right-sided abdominal pain with nausea  EXAM: US ABDOMEN LIMITED - RIGHT UPPER QUADRANT  COMPARISON:  None  FINDINGS: Gallbladder:  The gallbladder is adequately distended but contains a large stone impacted in the neck. This stone measures at least 6 cm diameter. There is sludge within the gallbladder. There is a positive sonographic Murphy's sign. There is gallbladder wall thickening to nearly 14 mm. There is no definite pericholecystic fluid.  Common bile duct:  Diameter: 2.9 mm.  Liver:  The liver exhibits normal echotexture with no focal mass nor ductal dilation.  IMPRESSION: Acute cholelithiasis with acute cholecystitis. The common bile duct and liver are unremarkable.   Electronically Signed   By: David  [place]   On: 05/21/2014 15:16     Anti-infectives: Anti-infectives   Start     Dose/Rate Route Frequency Ordered Stop   05/21/14 1630  cefTRIAXone (ROCEPHIN) 2 g in dextrose 5 % 50 mL IVPB    Comments:  Pharmacy may adjust dosing strength / duration / interval for maximal efficacy   2 g 100 mL/hr over 30 Minutes Intravenous Every 24 hours 05/21/14 1616        Assessment/Plan:  Cholecystitis with cholelithiasis  Plan lap chole today with cholangiogram. I discussed the procedure in detail.  .  We discussed the risks and benefits of a laparoscopic cholecystectomy and cholangiogram including, but not limited to bleeding, infection, injury to surrounding structures such as the intestine or liver, bile leak, retained gallstones, need to convert to an open procedure given size of the stone, prolonged diarrhea, blood clots such as  DVT, common bile duct injury, anesthesia risks, and possible need for additional procedures.  The likelihood of improvement in symptoms and return to the patient's normal status is good. We discussed the typical post-operative recovery course.  LOS: 1 day    Kano Heckmann A 05/22/2014

## 2014-05-22 NOTE — Anesthesia Postprocedure Evaluation (Signed)
  Anesthesia Post-op Note  Patient: Katherine Berry  Procedure(s) Performed: Procedure(s): LAPAROSCOPIC CHOLECYSTECTOMY (N/A)  Patient Location: PACU  Anesthesia Type:General  Level of Consciousness: awake, alert  and oriented  Airway and Oxygen Therapy: Patient Spontanous Breathing  Post-op Pain: mild  Post-op Assessment: Post-op Vital signs reviewed, Patient's Cardiovascular Status Stable, Respiratory Function Stable, Patent Airway, No signs of Nausea or vomiting and Pain level controlled  Post-op Vital Signs: Reviewed and stable  Last Vitals:  Filed Vitals:   05/22/14 1035  BP:   Pulse: 87  Temp:   Resp: 17    Complications: No apparent anesthesia complications

## 2014-05-23 LAB — CBC
HCT: 28 % — ABNORMAL LOW (ref 36.0–46.0)
Hemoglobin: 9.8 g/dL — ABNORMAL LOW (ref 12.0–15.0)
MCH: 31.4 pg (ref 26.0–34.0)
MCHC: 35 g/dL (ref 30.0–36.0)
MCV: 89.7 fL (ref 78.0–100.0)
Platelets: 207 K/uL (ref 150–400)
RBC: 3.12 MIL/uL — ABNORMAL LOW (ref 3.87–5.11)
RDW: 12.9 % (ref 11.5–15.5)
WBC: 9.5 K/uL (ref 4.0–10.5)

## 2014-05-23 MED ORDER — SODIUM CHLORIDE 0.9 % IV BOLUS (SEPSIS)
1000.0000 mL | Freq: Once | INTRAVENOUS | Status: AC
  Administered 2014-05-23: 1000 mL via INTRAVENOUS

## 2014-05-23 NOTE — Progress Notes (Signed)
After 1 liter NS bolus bp 84/50, 64.  Hgb 9.8. Notified MD on call.

## 2014-05-23 NOTE — Progress Notes (Signed)
Pt bp 84/44.  Notified MD on call. Received order for 1 liter NS bolus. Will recheck bp after bolus.

## 2014-05-23 NOTE — Progress Notes (Signed)
1 Day Post-Op  Subjective: Feeling better but still with moderate pain  Objective: Vital signs in last 24 hours: Temp:  [98.2 F (36.8 C)-99.2 F (37.3 C)] 98.4 F (36.9 C) (10/04 0626) Pulse Rate:  [59-111] 59 (10/04 0640) Resp:  [15-19] 17 (10/04 0626) BP: (83-112)/(40-72) 83/45 mmHg (10/04 0640) SpO2:  [98 %-100 %] 99 % (10/04 0626) Last BM Date: 05/20/14  Intake/Output from previous day: 10/03 0701 - 10/04 0700 In: 4670 [P.O.:120; I.V.:3500; IV Piggyback:1050] Out: 975 [Urine:950; Blood:25] Intake/Output this shift:    Abdomen soft, appropriately tender  Lab Results:   Recent Labs  05/22/14 0534 05/23/14 0257  WBC 10.5 9.5  HGB 11.3* 9.8*  HCT 32.2* 28.0*  PLT 228 207   BMET  Recent Labs  05/21/14 1416 05/22/14 0534  NA 137 137  K 3.7 3.7  CL 101 103  CO2 22 26  GLUCOSE 123* 106*  BUN 11 7  CREATININE 0.69 0.71  CALCIUM 9.1 8.2*   PT/INR No results found for this basename: LABPROT, INR,  in the last 72 hours ABG No results found for this basename: PHART, PCO2, PO2, HCO3,  in the last 72 hours  Studies/Results: [place] Abdomen Limited  05/21/2014   CLINICAL DATA:  Three-day history of right-sided abdominal pain with nausea  EXAM: US ABDOMEN LIMITED - RIGHT UPPER QUADRANT  COMPARISON:  None  FINDINGS: Gallbladder:  The gallbladder is adequately distended but contains a large stone impacted in the neck. This stone measures at least 6 cm diameter. There is sludge within the gallbladder. There is a positive sonographic Murphy's sign. There is gallbladder wall thickening to nearly 14 mm. There is no definite pericholecystic fluid.  Common bile duct:  Diameter: 2.9 mm.  Liver:  The liver exhibits normal echotexture with no focal mass nor ductal dilation.  IMPRESSION: Acute cholelithiasis with acute cholecystitis. The common bile duct and liver are unremarkable.   Electronically Signed   By: David  Swazil[place]   On: 05/21/2014 15:16    Anti-infectives: Anti-infectives    Start     Dose/Rate Route Frequency Ordered Stop   05/21/14 1630  cefTRIAXone (ROCEPHIN) 2 g in dextrose 5 % 50 mL IVPB    Comments:  Pharmacy may adjust dosing strength / duration / interval for maximal efficacy   2 g 100 mL/hr over 30 Minutes Intravenous Every 24 hours 05/21/14 1616        Assessment/Plan: s/p Procedure(s): LAPAROSCOPIC CHOLECYSTECTOMY (N/A)  Continue IV antibiotics and pain control Hopefully home tomorrow  LOS: 2 days    Katherine Berry A 05/23/2014

## 2014-05-24 ENCOUNTER — Encounter (HOSPITAL_COMMUNITY): Payer: Self-pay | Admitting: Surgery

## 2014-05-24 ENCOUNTER — Encounter: Payer: Self-pay | Admitting: General Surgery

## 2014-05-24 MED ORDER — IBUPROFEN 200 MG PO TABS: 400.0000 mg | ORAL_TABLET | Freq: Four times a day (QID) | ORAL | Status: DC | PRN

## 2014-05-24 MED ORDER — IBUPROFEN 200 MG PO TABS
ORAL_TABLET | ORAL | Status: DC
Start: 2014-05-24 — End: 2016-06-05

## 2014-05-24 MED ORDER — ACETAMINOPHEN 325 MG PO TABS: 650.0000 mg | ORAL_TABLET | Freq: Four times a day (QID) | ORAL | Status: DC | PRN

## 2014-05-24 MED ORDER — HYDROCODONE-ACETAMINOPHEN 5-325 MG PO TABS: 1.0000 | ORAL_TABLET | ORAL | Status: DC | PRN

## 2014-05-24 NOTE — Progress Notes (Signed)
2 Days Post-Op  Subjective: She has had dinner, but no breakfast, her English is very limited.  Port sites look fine.  She is still very sore. Objective: Vital signs in last 24 hours: Temp:  [98.4 F (36.9 C)-98.9 F (37.2 C)] 98.6 F (37 C) (10/05 0535) Pulse Rate:  [55-65] 59 (10/05 0535) Resp:  [16-17] 16 (10/05 0535) BP: (93-100)/(51-62) 100/53 mmHg (10/05 0535) SpO2:  [90 %-100 %] 90 % (10/05 0535) Last BM Date: 05/22/14 480 Po recorded 400 urine recorded. BP down some yesterday 100/53 this AM Labs OK No IOC  Intake/Output from previous day: 10/04 0701 - 10/05 0700 In: 2930 [P.O.:480; I.V.:2400; IV Piggyback:50] Out: 400 [Urine:400] Intake/Output this shift:    General appearance: alert, cooperative and no distress GI: soft, very sore, port sites look fine.  Lab Results:   Recent Labs  05/22/14 0534 05/23/14 0257  WBC 10.5 9.5  HGB 11.3* 9.8*  HCT 32.2* 28.0*  PLT 228 207    BMET  Recent Labs  05/21/14 1416 05/22/14 0534  NA 137 137  K 3.7 3.7  CL 101 103  CO2 22 26  GLUCOSE 123* 106*  BUN 11 7  CREATININE 0.69 0.71  CALCIUM 9.1 8.2*   PT/INR No results found for this basename: LABPROT, INR,  in the last 72 hours   Recent Labs Lab 05/21/14 1416 05/22/14 0534  AST 20 20  ALT 33 29  ALKPHOS 49 48  BILITOT 1.6* 1.6*  PROT 7.9 6.4  ALBUMIN 4.0 3.0*     Lipase     Component Value Date/Time   LIPASE 15 05/21/2014 1416     Studies/Results: No results found.  Medications: . cefTRIAXone (ROCEPHIN)  IV  2 g Intravenous Q24H  . enoxaparin (LOVENOX) injection  40 mg Subcutaneous Q24H   . sodium chloride 100 mL/hr at 05/24/14 0105   Prior to Admission medications   Medication Sig Start Date End Date Taking? Authorizing Provider  metroNIDAZOLE (FLAGYL) 500 MG tablet Take 500 mg by mouth 3 (three) times daily.   Yes Historical Provider, MD  omeprazole (PRILOSEC) 40 MG capsule Take 40 mg by mouth daily.   Yes Historical Provider, MD   promethazine (PHENERGAN) 25 MG tablet Take 25 mg by mouth every 6 (six) hours as needed for nausea or vomiting.   Yes Historical Provider, MD     Assessment/Plan Acute cholecystitis with cholelithiasis Laparoscopic cholecystectomy, Abigail Miyamotoouglas Blackman, M.D.05/22/2014 6 cm gallstone removed.    Plan:  Home after breakfast, follow up in the clinic in 2 weeks.  No strenuous activity for 2 weeks. i reviewed instructions and answered questions using interpreter  over the phone.   LOS: 3 days    JENNINGS,WILLARD 05/24/2014  Agree with above. Limited English. Walking the hall.  To home today.  Ovidio Kinavid Melecio Cueto, MD, [facility] Pager: [phone] Office phone:  [phone]

## 2014-05-24 NOTE — Discharge Instructions (Signed)
Colecistectoma laparoscpica, cuidados posteriores (Laparoscopic Cholecystectomy, Care After) Estas indicaciones le proporcionan informacin general acerca de cmo deber cuidarse despus del procedimiento. El mdico tambin podr darle instrucciones especficas. Comunquese con el mdico si tiene algn problema o tiene preguntas despus del procedimiento.  CUIDADOS EN EL HOGAR  Cambie los apsitos (vendajes) tal como le indic el mdico.  Mantenga la herida limpia y seca. Lave la herida suavemente con [place] jabonosa. Seque dando palmaditas con una toalla limpia y seca.  No se bae en la baera, no practique natacin ni use el jacuzzy durante 2semanas o segn las indicaciones del mdico.  Solo tome los medicamentos que le haya indicado su mdico.  Siga la dieta que le haya indicado el mdico.  No levante ningn objeto que pese ms de 10libras (4,5kg) hasta que el mdico lo autorice.  No practique deportes de contacto durante 1semana o segn las indicaciones del mdico. SOLICITE AYUDA SI:  Observa que la herida est roja, hinchada (inflamada) o le duele.  Observa una secrecin de color blanco amarillento (pus) en la herida.  Observa que un lquido supura por la herida durante ms de 1 da.  La herida tiene mal olor.  La herida se abre. SOLICITE AYUDA DE INMEDIATO SI:   Tiene una erupcin cutnea.  Tiene dificultad para respirar.  Siente dolor en el pecho.  Tiene fiebre.  Siente que Chief Technology Officerel dolor en los hombros (en la zona donde van los breteles) Harrington Challengerempeora.  Siente mareos o se desmaya.  Siente un dolor intenso en el vientre (abdominal).  Tiene malestar estomacal (nuseas) o vomita durante ms de 1da. Document Released: 04/18/2011 Document Revised: 05/27/2013 [facility] Patient Information 2015 [place]ExitCare, MarylandLL[place]. This information is not intended to replace advice given to you by your health care provider. Make sure you discuss any questions you have with your health care  provider.  CIRUGIA LAPAROSCOPICA: INSTRUCCIONES DE POST OPERATORIO.  Revise siempre los documentos que le entreguen en el lugar donde se ha hecho la [place].  SI USTED NECESITA DOCUMENTOS DE INCAPACIDAD (DISABLE) O DE PERMISO FAMILAR (FAMILY LEAVE) NECESITA TRAERLOS A LA OFICINA PARA QUE SEAN PROCESADOS. NO  SE LOS DE A SU DOCTOR. 1. A su alta del hospital se le dara una receta para Human resources officercontrolar el dolor. Tomela como ha sido recetada, si la necesita. Si no la necesita puede tomar, Acetaminofen (Tylenol) o Ibuprofen (Advil) para aliviar dolor moderado. 2. Continue tomando el resto de sus medicinas. 3. Si necesita rellenar la receta, llame a la farmacia. ellos contactan a nuestra oficina pidiendo autorizacion. Este tipo de receta no pueden ser PACCAR Increllenadas despues de las  5pm o Energy Transfer Partnersdurante los fines de [place]. 4. Con relacion a la dieta: debe ser [place] Corporationligera los primeros dias despues que llege a la casa. Ejemplo: sopas y galleticas. Tome bastante liquido esos dias. 5. La mayoria de los pacientes padecen de inflamacion y cambio de coloracion de la piel alrededor de las incisiones. esto toma dias en resolver.  pnerse una bolsa de hielo en el area affectada ayuda..  6. Es comun tambien tener un poco de estrenimiento si esta tomado medicinas para Chief Technology Officerel dolor. incremente la cantidad de liquidos a tomar y Engineer, productionpuede tomar (Colace) esto previene el problema. Si ya tiene estrenimiento, es Designer, jewellerydecir no ha defecado en 48 horas, puede tomar un laxativo (Milk of Magnesia or Miralax) uselo como el paquete le explica. 7.  A menos que se le diga algo diferente. Remueva el bendaje a las 24-48 horas despues dela [place]. y puede banarse en  la ducha sin ningun problema. usted puede tener steri-strips (pequenas curitas transparentes en la piel puesta encima de la incision)  Estas banditas strips should be left on the skin for 7-10 days.   Si su cirujano puso pegamento encima de la incision usted puede banarse bajo la ducha en 24 horas. Este  pegamento empezara a caerse en las proximas 2-3 semanas. Si le pusieron suturas o presillas (grapos) estos seran quitados en su proxima cita en la oficina. Marland Kitchen a. ACTIVIDADES:  Puede hacer actividad ligera.  Como caminar , subir escaleras y poco a poco irlas incrementando tanto como las [place]. Puede tener relaciones sexuales cuando sea comfortable. No carge objetos pesados o haga esfuerzos que no sean aprovados por su doctor. b. Puede manejar en cuanto no esta tomando medicamentos fuertes (narcoticos) para Chief Technology Officer, pueda abrochar confortablemente el cinturon de seguridad, y pueda Personnel officer y usar los pedales de su vehiculo con seguridad. c. PUEDE REGRESAR A TRABAJAR  8. Debe ver a su doctor para una cita de seguimiento en 2-3 semanas despues de la [place].  9. OTRAS ISNSTRUCCIONES:___________________________________________________________________________________ Debbora Lacrosse A SU MEDICO: 1. FIEBRE mayor de  101.0 2. No produccion de [place]. 3. Sangramiento continue de la herida 4. Incremento de dolor, enrojecimientio o drenaje de la herida (incision) 5. Incremento de dolor abdominal.  The clinic staff is available to answer your questions during regular business hours.  Please dont hesitate to call and ask to speak to one of the nurses for clinical concerns.  If you have a medical emergency, go to the nearest emergency room or call 911.  A surgeon from [facility] is always on call at the hospital. [address], [place], [place]  [address] ? [address] [address], [place], [place]   [address] [phone] ? [phone] ? FAX [phone] Web site: [email or web address]

## 2014-05-28 NOTE — Discharge Summary (Signed)
Physician Discharge Summary  Patient ID: Katherine Berry MRN: [id number] DOB/AGE: 30/20/1987 30 y.o.  Admit date: 05/21/2014 Discharge date: 05/23/2014  Admission Diagnoses:   Acute cholecystitis with cholelithiasis   Discharge Diagnoses:  Acute cholecystitis with cholelithiasis   Active Problems:   Acute calculous cholecystitis   PROCEDURES: Laparoscopic cholecystectomy, Katherine Berry, Katherine Berry10/10/2013.   6 cm gallstone removed.     Hospital Course: Katherine Berry is a healthy 28 year old Hispanic female who presents with abdominal pain. Duration of symptoms is 3 days. Coarse is worsening. Time pattern is constant. Severe in severity. Location is RUQ, epigastric region with radiation to the back. Aggravated by food, moving. No alleviating factors. No modifying factors. Associated with nausea, vomiting, fevers, chills. Denies diarrhea, constipation, melena, hematochezia or dysuria. She has not other medical problems, no previous surgeries. She reports similar symptoms about 11 years ago. Her work up shows a white count of 15.7K. Bilirubin of 1.6, normal AST and ALT. US of abdomen showed a 2.349mm CBD, 6cm gallstone, sludge, positive sonographic murphy's sign, impacted stone at the neck of the gallbladder. Pt was admitted and went to the OR.  She had a 6cm gallstone rem0ved.  She improved and was ready for discharge on 05/23/14.  Condition on d/c:  Improved  Disposition: 01-Home or Self Care     Medication List    STOP taking these medications       metroNIDAZOLE 500 MG tablet  Commonly known as:  FLAGYL     omeprazole 40 MG capsule  Commonly known as:  PRILOSEC     promethazine 25 MG tablet  Commonly known as:  PHENERGAN      TAKE these medications       acetaminophen 325 MG tablet  Commonly known as:  TYLENOL  Take 2 tablets (650 mg total) by mouth every 6 (six) hours as needed for mild pain (or Temp > 100).     HYDROcodone-acetaminophen 5-325 MG per tablet  Commonly  known as:  NORCO/VICODIN  Take 1-2 tablets by mouth every 4 (four) hours as needed for moderate pain.     ibuprofen 200 MG tablet  Commonly known as:  MOTRIN IB  You can take 2-3 tablets every 6 hours as needed for pain.           Follow-up Information   Schedule an appointment as soon as possible for a visit with [facility]. ([facility] and ask for the [facility] clinic.  Bring family member to help with translation when you come back to office.)    Contact information:   [address]   [place] [address] [phone]       Please follow up. (No lifting over 20 pounds for four weeks.  Nothing strenous for 2 weeks.  You are going to be very sore.)       Signed: Sherrie Berry,Katherine Berry 05/28/2014, 4:18 PM  Agree with above.  Katherine Kinavid Aujanae Mccullum, MD, [facility] Pager: [phone] Office phone:  [phone]

## 2014-12-06 ENCOUNTER — Telehealth: Payer: Self-pay | Admitting: *Deleted

## 2014-12-06 NOTE — Telephone Encounter (Signed)
Patient requesting an appointment for a Mirena IUD removal. [facility] in our office on 12-12-2009. Patient is self pay and the discounted amount patient will be responsible for is $110.  Attempted to contact the patient and left a message for patient to contact the office.

## 2014-12-07 NOTE — Telephone Encounter (Signed)
Patient stopped by the office and appointment has been scheduled for 12-10-14. Patient made aware of payment due and verbalizes understanding.

## 2014-12-10 ENCOUNTER — Encounter: Payer: Self-pay | Admitting: Certified Nurse Midwife

## 2014-12-10 ENCOUNTER — Ambulatory Visit (INDEPENDENT_AMBULATORY_CARE_PROVIDER_SITE_OTHER): Payer: Self-pay | Admitting: Certified Nurse Midwife

## 2014-12-10 VITALS — BP 111/73 | HR 73 | Temp 97.5°F | Ht 63.5 in | Wt 145.0 lb

## 2014-12-10 DIAGNOSIS — Z3009 Encounter for other general counseling and advice on contraception: Secondary | ICD-10-CM

## 2014-12-10 DIAGNOSIS — Z309 Encounter for contraceptive management, unspecified: Secondary | ICD-10-CM

## 2014-12-10 DIAGNOSIS — Z30432 Encounter for removal of intrauterine contraceptive device: Secondary | ICD-10-CM

## 2014-12-10 NOTE — Progress Notes (Signed)
Patient ID: Katherine Berry, female   DOB: [birth year], 29 y.o.   MRN: [id number]   Chief Complaint  Patient presents with  . Procedure    Mirena IUD removal    HPI Katherine Berry is a 29 y.o. female.  Desires removal of Mirena IUD d/t it being 5 years since the IUD was placed.  Educated on safety, risks and benefits of keeping the IUD in place until she can obtain Medicaid or other insurance.  Patient still desired removal of IUD.  Patient is Hispanic and spanish speaking, but can understand [place].  Patient verbalized understanding.  3 months of Lo[place]strin FE given to the patient.  Patient stated that she would go to social services and apply for Medicaid.      HPI  History reviewed. No pertinent past medical history.  Past Surgical History  Procedure Laterality Date  . Cholecystectomy N/A 05/22/2014    Procedure: LAPAROSCOPIC CHOLECYSTECTOMY;  Surgeon: Abigail Miyamoto, MD;  Location: [facility];  Service: General;  Laterality: N/A;    History reviewed. No pertinent family history.  Social History History  Substance Use Topics  . Smoking status: Never Smoker   . Smokeless tobacco: Never Used  . Alcohol Use: No    No Known Allergies  Current Outpatient Prescriptions  Medication Sig Dispense Refill  . acetaminophen (TYLENOL) 325 MG tablet Take 2 tablets (650 mg total) by mouth every 6 (six) hours as needed for mild pain (or Temp > 100).    Marland Kitchen HYDROcodone-acetaminophen (NORCO/VICODIN) 5-325 MG per tablet Take 1-2 tablets by mouth every 4 (four) hours as needed for moderate pain. 40 tablet 0  . ibuprofen (MOTRIN IB) 200 MG tablet You can take 2-3 tablets every 6 hours as needed for pain. 30 tablet 0   No current facility-administered medications for this visit.    Review of Systems Review of Systems Constitutional: negative for fatigue and weight loss Respiratory: negative for cough and wheezing Cardiovascular: negative for chest pain, fatigue and palpitations Gastrointestinal:  negative for abdominal pain and change in bowel habits Genitourinary:negative Integument/breast: negative for nipple discharge Musculoskeletal:negative for myalgias Neurological: negative for gait problems and tremors Behavioral/Psych: negative for abusive relationship, depression Endocrine: negative for temperature intolerance     Blood pressure 111/73, pulse 73, temperature 97.5 F (36.4 C), height 5' 3.5" (1.613 m), weight 65.772 kg (145 lb).  Physical Exam Physical Exam General:   alert  Skin:   no rash or abnormalities  Lungs:   clear to auscultation bilaterally  Heart:   regular rate and rhythm, S1, S2 normal, no murmur, click, rub or gallop  Breasts:   deferred  Abdomen:  normal findings: no organomegaly, soft, non-tender and no hernia  Pelvis:  External genitalia: normal general appearance Urinary system: urethral meatus normal and bladder without fullness, nontender Vaginal: normal without tenderness, induration or masses Cervix: normal appearance, + IUD strings Adnexa: normal bimanual exam Uterus: anteverted and non-tender, normal size    Speculum placed.Strings well visualized before removal. Patient told to deep breath. IUD easily removed intact, patient tolerated the procedure well.  No bleeding with procedure.  50% of 15 min visit spent on counseling and coordination of care.   Data Reviewed Previous medical hx, labs, meds  Assessment     IUD removed intact  Contraception Counseling    Plan    No orders of the defined types were placed in this encounter.   No orders of the defined types were placed in this encounter.    Follow  up with Mirena IUD insertion when she gets insurance.

## 2015-12-23 IMAGING — US US ABDOMEN LIMITED
1 series · 14 of 25 positions shown · non-contrast
Comparison: None

CLINICAL DATA: Three-day history of right-sided abdominal pain with
nausea

EXAM:
US ABDOMEN LIMITED - RIGHT UPPER QUADRANT

[Series 1: us abdomen limited · 0.24mm/px · 14 of 62 slices shown]
[im 1/62]
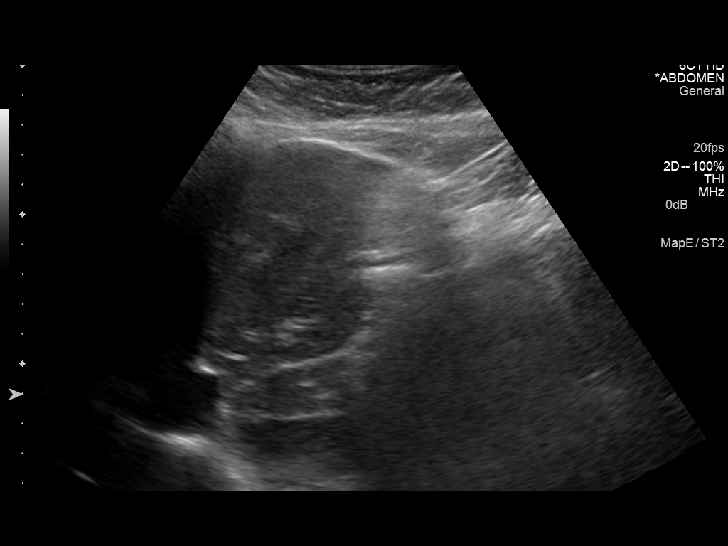
[im 6/62]
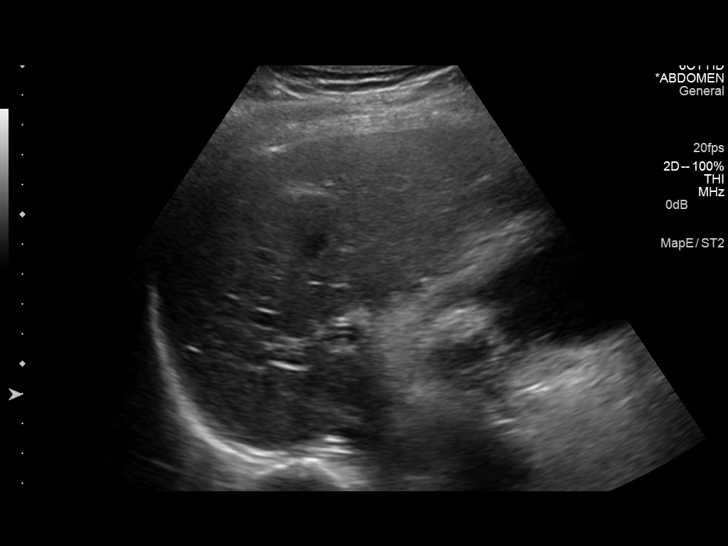
[im 11/62]
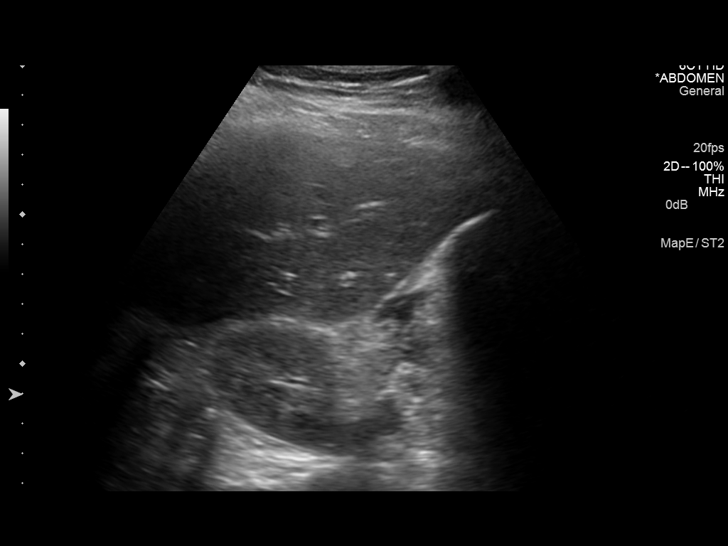
[im 16/62]
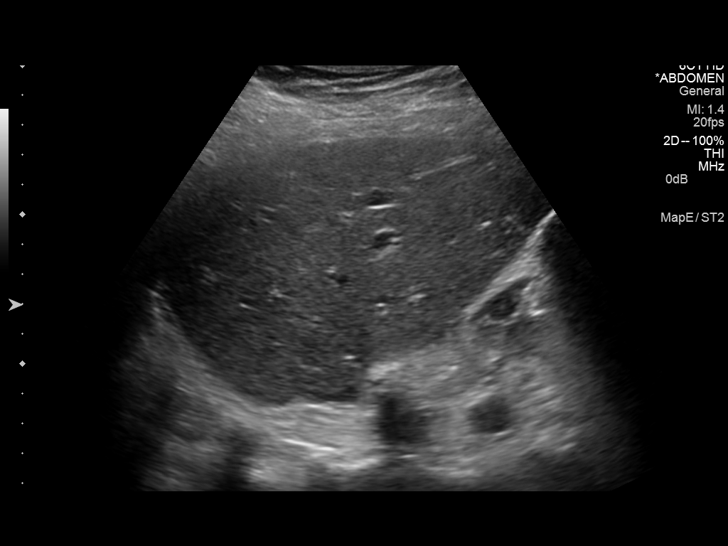
[im 21/62]
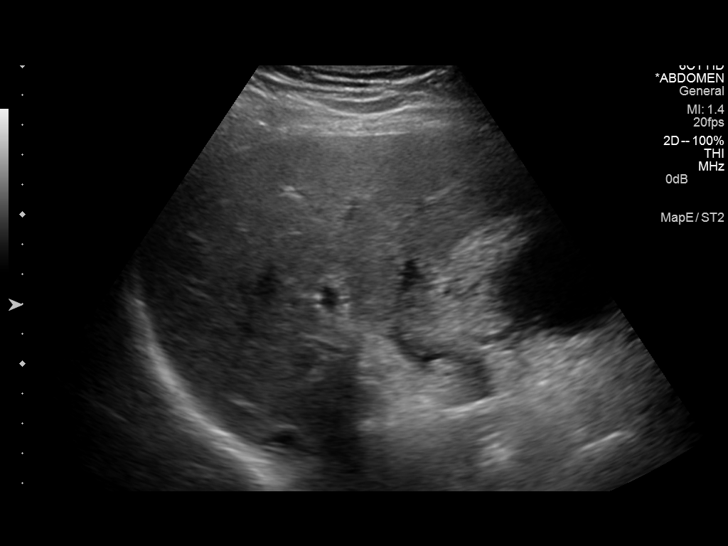
[im 23/62]
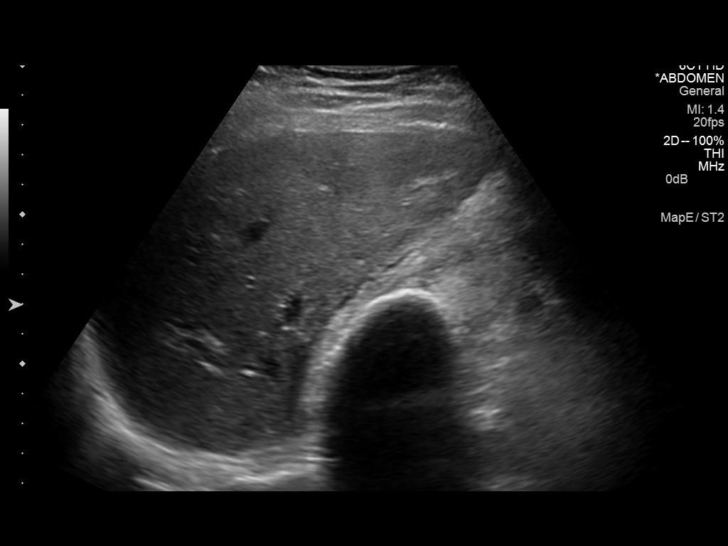
[im 28/62]
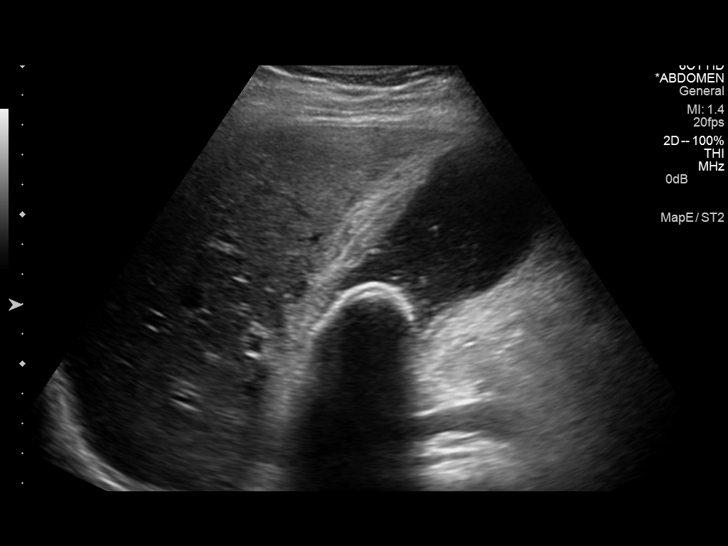
[im 34/62]
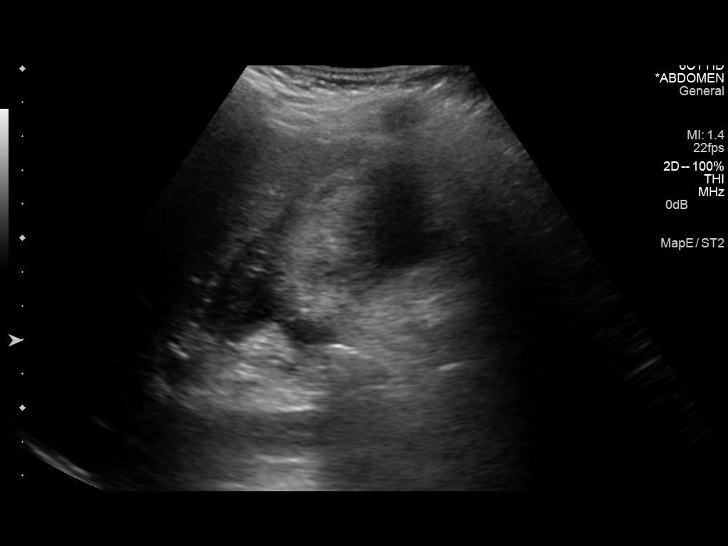
[im 39/62]
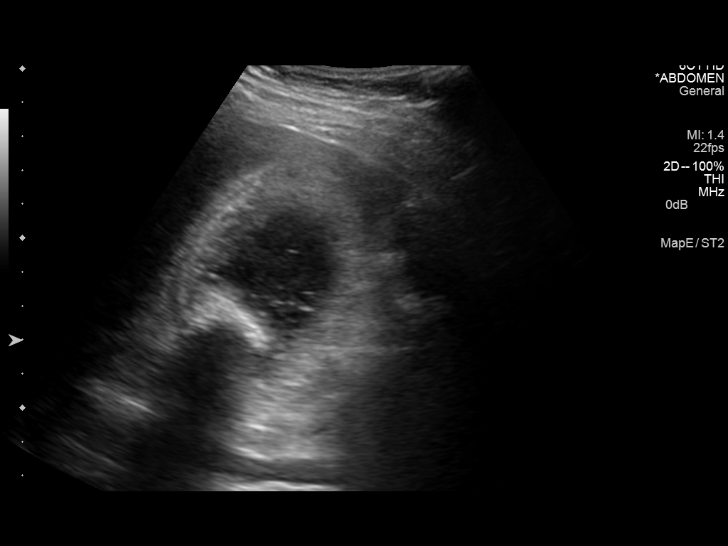
[im 41/62]
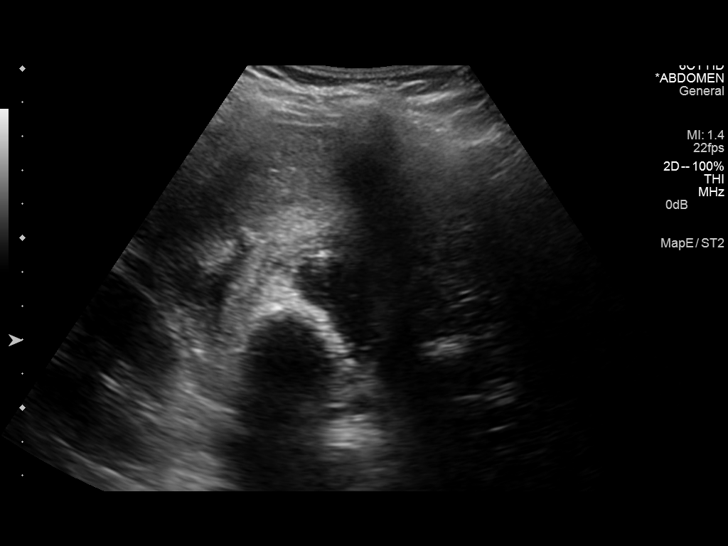
[im 46/62]
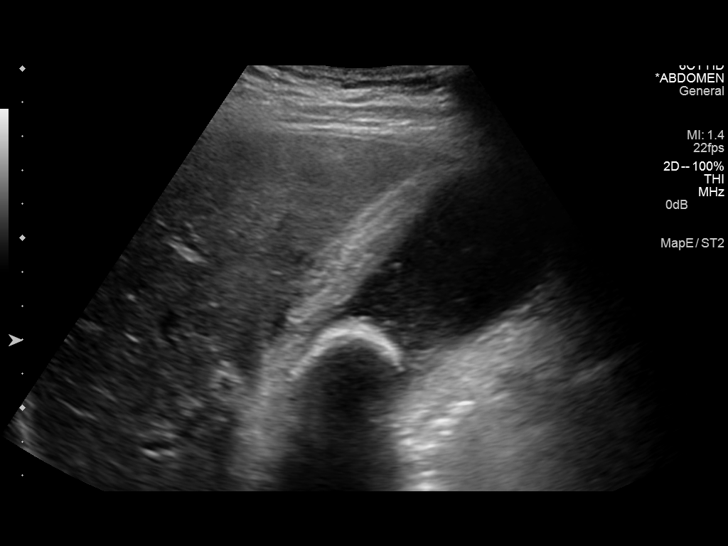
[im 51/62]
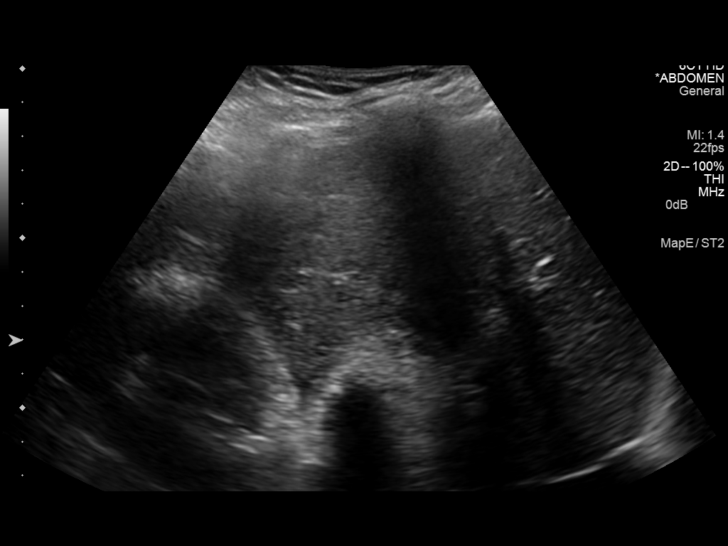
[im 56/62]
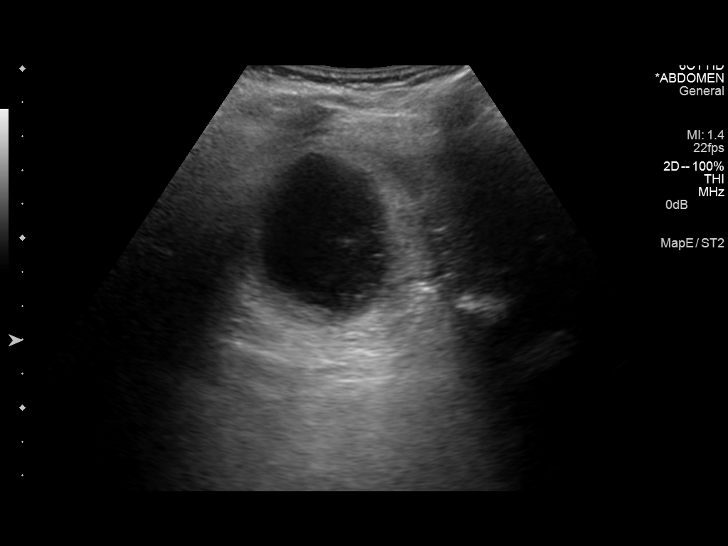
[im 62/62]
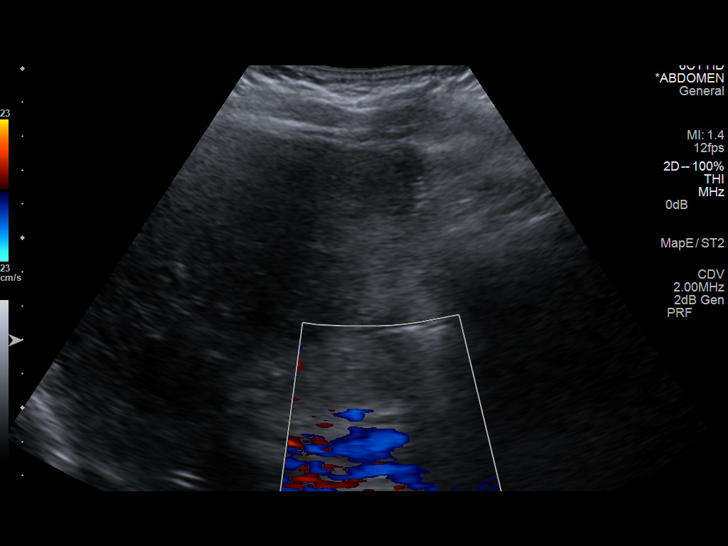

[14 of 25 positions shown; findings below may reference images not displayed]

FINDINGS: Gallbladder:

The gallbladder is adequately distended but contains a large stone
impacted in the neck. This stone measures at least 6 cm diameter.
There is sludge within the gallbladder. There is a positive
sonographic Murphy's sign. There is gallbladder wall thickening to
nearly 14 mm. There is no definite pericholecystic fluid.

Common bile duct:

Diameter: 2.9 mm.

Liver:

The liver exhibits normal echotexture with no focal mass nor ductal
dilation.
IMPRESSION: Acute cholelithiasis with acute cholecystitis. The common bile duct
and liver are unremarkable.

## 2016-03-29 LAB — OB RESULTS CONSOLE ABO/RH: RH TYPE: POSITIVE

## 2016-03-29 LAB — OB RESULTS CONSOLE HIV ANTIBODY (ROUTINE TESTING): HIV: NONREACTIVE

## 2016-03-29 LAB — OB RESULTS CONSOLE HEPATITIS B SURFACE ANTIGEN: Hepatitis B Surface Ag: NEGATIVE

## 2016-03-29 LAB — OB RESULTS CONSOLE RPR: RPR: NONREACTIVE

## 2016-03-29 LAB — OB RESULTS CONSOLE RUBELLA ANTIBODY, IGM: Rubella: IMMUNE

## 2016-04-30 LAB — OB RESULTS CONSOLE GBS: STREP GROUP B AG: POSITIVE

## 2016-04-30 LAB — OB RESULTS CONSOLE GC/CHLAMYDIA
Chlamydia: NEGATIVE
GC PROBE AMP, GENITAL: NEGATIVE

## 2016-06-05 ENCOUNTER — Inpatient Hospital Stay (HOSPITAL_COMMUNITY)
Admission: RE | Admit: 2016-06-05 | Discharge: 2016-06-07 | DRG: 766 | Disposition: A | Discharge status: Home / Self Care | Payer: Medicaid Other | Source: Ambulatory Visit | Attending: Obstetrics & Gynecology | Admitting: Obstetrics & Gynecology

## 2016-06-05 ENCOUNTER — Encounter (HOSPITAL_COMMUNITY)
Admission: RE | Disposition: A | Discharge status: Home / Self Care | Payer: Self-pay | Source: Ambulatory Visit | Attending: Obstetrics & Gynecology

## 2016-06-05 ENCOUNTER — Encounter (HOSPITAL_COMMUNITY): Payer: Self-pay

## 2016-06-05 ENCOUNTER — Inpatient Hospital Stay (HOSPITAL_COMMUNITY): Payer: Medicaid Other | Admitting: Anesthesiology

## 2016-06-05 DIAGNOSIS — Z3A41 41 weeks gestation of pregnancy: Secondary | ICD-10-CM

## 2016-06-05 DIAGNOSIS — Z98891 History of uterine scar from previous surgery: Secondary | ICD-10-CM

## 2016-06-05 DIAGNOSIS — O328XX0 Maternal care for other malpresentation of fetus, not applicable or unspecified: Principal | ICD-10-CM | POA: Diagnosis present

## 2016-06-05 DIAGNOSIS — O99824 Streptococcus B carrier state complicating childbirth: Secondary | ICD-10-CM | POA: Diagnosis present

## 2016-06-05 DIAGNOSIS — O321XX0 Maternal care for breech presentation, not applicable or unspecified: Secondary | ICD-10-CM | POA: Diagnosis present

## 2016-06-05 LAB — TYPE AND SCREEN
ABO/RH(D): A POS
Antibody Screen: NEGATIVE

## 2016-06-05 LAB — CBC
HCT: 38.1 % (ref 36.0–46.0)
Hemoglobin: 13.6 g/dL (ref 12.0–15.0)
MCH: 32.5 pg (ref 26.0–34.0)
MCHC: 35.7 g/dL (ref 30.0–36.0)
MCV: 91.1 fL (ref 78.0–100.0)
PLATELETS: 252 10*3/uL (ref 150–400)
RBC: 4.18 MIL/uL (ref 3.87–5.11)
RDW: 13.3 % (ref 11.5–15.5)
WBC: 9.3 10*3/uL (ref 4.0–10.5)

## 2016-06-05 LAB — ABO/RH: ABO/RH(D): A POS

## 2016-06-05 SURGERY — Surgical Case
Anesthesia: Spinal | Site: Abdomen

## 2016-06-05 MED ORDER — NALBUPHINE HCL 10 MG/ML IJ SOLN: 5.0000 mg | Freq: Once | INTRAMUSCULAR | Status: DC | PRN

## 2016-06-05 MED ORDER — MENTHOL 3 MG MT LOZG: 1.0000 | LOZENGE | OROMUCOSAL | Status: DC | PRN

## 2016-06-05 MED ORDER — FERROUS SULFATE 325 (65 FE) MG PO TABS
325.0000 mg | ORAL_TABLET | Freq: Two times a day (BID) | ORAL | Status: DC
  Administered 2016-06-06 – 2016-06-07 (×3): 325 mg via ORAL
  Filled 2016-06-05 (×3): qty 1

## 2016-06-05 MED ORDER — LACTATED RINGERS IV SOLN: INTRAVENOUS | Status: DC

## 2016-06-05 MED ORDER — SIMETHICONE 80 MG PO CHEW
80.0000 mg | CHEWABLE_TABLET | ORAL | Status: DC
Start: 2016-06-06 — End: 2016-06-07
  Administered 2016-06-05 – 2016-06-06 (×2): 80 mg via ORAL
  Filled 2016-06-05 (×2): qty 1

## 2016-06-05 MED ORDER — ONDANSETRON HCL 4 MG/2ML IJ SOLN
INTRAMUSCULAR | Status: DC | PRN
  Administered 2016-06-05: 4 mg via INTRAVENOUS

## 2016-06-05 MED ORDER — ACETAMINOPHEN 325 MG PO TABS
650.0000 mg | ORAL_TABLET | ORAL | Status: DC | PRN
  Administered 2016-06-06 (×2): 650 mg via ORAL
  Filled 2016-06-05 (×2): qty 2

## 2016-06-05 MED ORDER — OXYTOCIN 10 UNIT/ML IJ SOLN
INTRAMUSCULAR | Status: AC
  Filled 2016-06-05: qty 4

## 2016-06-05 MED ORDER — PRENATAL MULTIVITAMIN CH
1.0000 | ORAL_TABLET | Freq: Every day | ORAL | Status: DC
Start: 2016-06-06 — End: 2016-06-07
  Administered 2016-06-06 – 2016-06-07 (×2): 1 via ORAL
  Filled 2016-06-05 (×2): qty 1

## 2016-06-05 MED ORDER — OXYCODONE-ACETAMINOPHEN 5-325 MG PO TABS: 2.0000 | ORAL_TABLET | ORAL | Status: DC | PRN

## 2016-06-05 MED ORDER — KETOROLAC TROMETHAMINE 30 MG/ML IJ SOLN
30.0000 mg | Freq: Four times a day (QID) | INTRAMUSCULAR | Status: DC | PRN
  Administered 2016-06-05: 30 mg via INTRAMUSCULAR

## 2016-06-05 MED ORDER — NALOXONE HCL 2 MG/2ML IJ SOSY: 1.0000 ug/kg/h | PREFILLED_SYRINGE | INTRAVENOUS | Status: DC | PRN

## 2016-06-05 MED ORDER — BUPIVACAINE HCL (PF) 0.5 % IJ SOLN
INTRAMUSCULAR | Status: AC
  Filled 2016-06-05: qty 30

## 2016-06-05 MED ORDER — TERBUTALINE SULFATE 1 MG/ML IJ SOLN
INTRAMUSCULAR | Status: AC
  Filled 2016-06-05: qty 1

## 2016-06-05 MED ORDER — MAGNESIUM HYDROXIDE 400 MG/5ML PO SUSP: 30.0000 mL | ORAL | Status: DC | PRN

## 2016-06-05 MED ORDER — OXYCODONE-ACETAMINOPHEN 5-325 MG PO TABS: 1.0000 | ORAL_TABLET | ORAL | Status: DC | PRN

## 2016-06-05 MED ORDER — DIPHENHYDRAMINE HCL 50 MG/ML IJ SOLN: 12.5000 mg | INTRAMUSCULAR | Status: DC | PRN

## 2016-06-05 MED ORDER — SODIUM CHLORIDE 0.9 % IR SOLN
Status: DC | PRN
  Administered 2016-06-05: 1000 mL

## 2016-06-05 MED ORDER — ONDANSETRON HCL 4 MG/2ML IJ SOLN
INTRAMUSCULAR | Status: AC
  Filled 2016-06-05: qty 2

## 2016-06-05 MED ORDER — LIDOCAINE HCL (PF) 1 % IJ SOLN: 30.0000 mL | INTRAMUSCULAR | Status: DC | PRN

## 2016-06-05 MED ORDER — MORPHINE SULFATE (PF) 0.5 MG/ML IJ SOLN
INTRAMUSCULAR | Status: DC | PRN
  Administered 2016-06-05: .2 mg via INTRATHECAL

## 2016-06-05 MED ORDER — SOD CITRATE-CITRIC ACID 500-334 MG/5ML PO SOLN
30.0000 mL | ORAL | Status: DC | PRN
  Filled 2016-06-05: qty 15

## 2016-06-05 MED ORDER — MEASLES, MUMPS & RUBELLA VAC ~~LOC~~ INJ: 0.5000 mL | INJECTION | Freq: Once | SUBCUTANEOUS | Status: DC

## 2016-06-05 MED ORDER — OXYTOCIN 10 UNIT/ML IJ SOLN
INTRAMUSCULAR | Status: AC
  Filled 2016-06-05: qty 2

## 2016-06-05 MED ORDER — BUPIVACAINE HCL (PF) 0.5 % IJ SOLN
INTRAMUSCULAR | Status: DC | PRN
  Administered 2016-06-05: 30 mL

## 2016-06-05 MED ORDER — DIBUCAINE 1 % RE OINT: 1.0000 "application " | TOPICAL_OINTMENT | RECTAL | Status: DC | PRN

## 2016-06-05 MED ORDER — NALBUPHINE HCL 10 MG/ML IJ SOLN: 5.0000 mg | INTRAMUSCULAR | Status: DC | PRN

## 2016-06-05 MED ORDER — KETOROLAC TROMETHAMINE 30 MG/ML IJ SOLN: 30.0000 mg | Freq: Four times a day (QID) | INTRAMUSCULAR | Status: DC | PRN

## 2016-06-05 MED ORDER — OXYTOCIN 10 UNIT/ML IJ SOLN
INTRAVENOUS | Status: DC | PRN
  Administered 2016-06-05: 40 [IU] via INTRAVENOUS

## 2016-06-05 MED ORDER — DEXAMETHASONE SODIUM PHOSPHATE 4 MG/ML IJ SOLN
INTRAMUSCULAR | Status: AC
  Filled 2016-06-05: qty 1

## 2016-06-05 MED ORDER — KETOROLAC TROMETHAMINE 30 MG/ML IJ SOLN
INTRAMUSCULAR | Status: AC
  Filled 2016-06-05: qty 1

## 2016-06-05 MED ORDER — LACTATED RINGERS IV SOLN
INTRAVENOUS | Status: DC
  Administered 2016-06-05 (×3): via INTRAVENOUS

## 2016-06-05 MED ORDER — PHENYLEPHRINE 8 MG IN D5W 100 ML (0.08MG/ML) PREMIX OPTIME
INJECTION | INTRAVENOUS | Status: DC | PRN
  Administered 2016-06-05: 60 ug/min via INTRAVENOUS

## 2016-06-05 MED ORDER — FENTANYL CITRATE (PF) 100 MCG/2ML IJ SOLN: 25.0000 ug | INTRAMUSCULAR | Status: DC | PRN

## 2016-06-05 MED ORDER — FLEET ENEMA 7-19 GM/118ML RE ENEM: 1.0000 | ENEMA | RECTAL | Status: DC | PRN

## 2016-06-05 MED ORDER — ONDANSETRON HCL 4 MG/2ML IJ SOLN: 4.0000 mg | Freq: Four times a day (QID) | INTRAMUSCULAR | Status: DC | PRN

## 2016-06-05 MED ORDER — SCOPOLAMINE 1 MG/3DAYS TD PT72: 1.0000 | MEDICATED_PATCH | Freq: Once | TRANSDERMAL | Status: DC

## 2016-06-05 MED ORDER — EPHEDRINE SULFATE 50 MG/ML IJ SOLN
INTRAMUSCULAR | Status: DC | PRN
  Administered 2016-06-05: 10 mg via INTRAVENOUS

## 2016-06-05 MED ORDER — OXYTOCIN 40 UNITS IN LACTATED RINGERS INFUSION - SIMPLE MED: 2.5000 [IU]/h | INTRAVENOUS | Status: AC

## 2016-06-05 MED ORDER — EPHEDRINE 5 MG/ML INJ
INTRAVENOUS | Status: AC
  Filled 2016-06-05: qty 10

## 2016-06-05 MED ORDER — MEPERIDINE HCL 25 MG/ML IJ SOLN: 6.2500 mg | INTRAMUSCULAR | Status: DC | PRN

## 2016-06-05 MED ORDER — BUPIVACAINE IN DEXTROSE 0.75-8.25 % IT SOLN
INTRATHECAL | Status: DC | PRN
  Administered 2016-06-05: 1.3 mL via INTRATHECAL

## 2016-06-05 MED ORDER — IBUPROFEN 600 MG PO TABS
600.0000 mg | ORAL_TABLET | Freq: Four times a day (QID) | ORAL | Status: DC
Start: 2016-06-06 — End: 2016-06-07
  Administered 2016-06-05 – 2016-06-07 (×7): 600 mg via ORAL
  Filled 2016-06-05 (×7): qty 1

## 2016-06-05 MED ORDER — TERBUTALINE SULFATE 1 MG/ML IJ SOLN
0.2500 mg | Freq: Once | INTRAMUSCULAR | Status: AC
  Administered 2016-06-05: 0.25 mg via SUBCUTANEOUS
  Filled 2016-06-05: qty 1

## 2016-06-05 MED ORDER — ZOLPIDEM TARTRATE 5 MG PO TABS: 5.0000 mg | ORAL_TABLET | Freq: Every evening | ORAL | Status: DC | PRN

## 2016-06-05 MED ORDER — SIMETHICONE 80 MG PO CHEW: 80.0000 mg | CHEWABLE_TABLET | ORAL | Status: DC | PRN

## 2016-06-05 MED ORDER — ONDANSETRON HCL 4 MG/2ML IJ SOLN: 4.0000 mg | Freq: Three times a day (TID) | INTRAMUSCULAR | Status: DC | PRN

## 2016-06-05 MED ORDER — LACTATED RINGERS IV SOLN
INTRAVENOUS | Status: DC
  Administered 2016-06-05: via INTRAVENOUS

## 2016-06-05 MED ORDER — LACTATED RINGERS IV SOLN
INTRAVENOUS | Status: DC | PRN
  Administered 2016-06-05: 15:00:00 via INTRAVENOUS

## 2016-06-05 MED ORDER — SCOPOLAMINE 1 MG/3DAYS TD PT72
MEDICATED_PATCH | TRANSDERMAL | Status: AC
  Filled 2016-06-05: qty 1

## 2016-06-05 MED ORDER — PHENYLEPHRINE 8 MG IN D5W 100 ML (0.08MG/ML) PREMIX OPTIME
INJECTION | INTRAVENOUS | Status: AC
  Filled 2016-06-05: qty 100

## 2016-06-05 MED ORDER — FENTANYL CITRATE (PF) 100 MCG/2ML IJ SOLN
INTRAMUSCULAR | Status: AC
  Filled 2016-06-05: qty 2

## 2016-06-05 MED ORDER — SODIUM CHLORIDE 0.9% FLUSH
3.0000 mL | INTRAVENOUS | Status: DC | PRN
Start: 2016-06-05 — End: 2016-06-07

## 2016-06-05 MED ORDER — CEFAZOLIN SODIUM-DEXTROSE 2-4 GM/100ML-% IV SOLN
2.0000 g | INTRAVENOUS | Status: AC
  Administered 2016-06-05: 2 g via INTRAVENOUS

## 2016-06-05 MED ORDER — OXYTOCIN BOLUS FROM INFUSION: 500.0000 mL | Freq: Once | INTRAVENOUS | Status: DC

## 2016-06-05 MED ORDER — TETANUS-DIPHTH-ACELL PERTUSSIS 5-2.5-18.5 LF-MCG/0.5 IM SUSP: 0.5000 mL | Freq: Once | INTRAMUSCULAR | Status: DC

## 2016-06-05 MED ORDER — OXYCODONE-ACETAMINOPHEN 5-325 MG PO TABS
1.0000 | ORAL_TABLET | ORAL | Status: DC | PRN
Start: 2016-06-05 — End: 2016-06-05

## 2016-06-05 MED ORDER — TERBUTALINE SULFATE 1 MG/ML IJ SOLN
0.2500 mg | Freq: Once | INTRAMUSCULAR | Status: AC
  Administered 2016-06-05: 0.25 mg via SUBCUTANEOUS

## 2016-06-05 MED ORDER — SOD CITRATE-CITRIC ACID 500-334 MG/5ML PO SOLN
30.0000 mL | ORAL | Status: AC
  Administered 2016-06-05: 30 mL via ORAL

## 2016-06-05 MED ORDER — SENNOSIDES-DOCUSATE SODIUM 8.6-50 MG PO TABS
2.0000 | ORAL_TABLET | ORAL | Status: DC
  Administered 2016-06-05: 2 via ORAL
  Filled 2016-06-05: qty 2

## 2016-06-05 MED ORDER — DIPHENHYDRAMINE HCL 25 MG PO CAPS: 25.0000 mg | ORAL_CAPSULE | Freq: Four times a day (QID) | ORAL | Status: DC | PRN

## 2016-06-05 MED ORDER — ACETAMINOPHEN 325 MG PO TABS: 650.0000 mg | ORAL_TABLET | ORAL | Status: DC | PRN

## 2016-06-05 MED ORDER — DIPHENHYDRAMINE HCL 25 MG PO CAPS: 25.0000 mg | ORAL_CAPSULE | ORAL | Status: DC | PRN

## 2016-06-05 MED ORDER — COCONUT OIL OIL: 1.0000 "application " | TOPICAL_OIL | Status: DC | PRN

## 2016-06-05 MED ORDER — WITCH HAZEL-GLYCERIN EX PADS: 1.0000 "application " | MEDICATED_PAD | CUTANEOUS | Status: DC | PRN

## 2016-06-05 MED ORDER — METOCLOPRAMIDE HCL 5 MG/ML IJ SOLN: 10.0000 mg | Freq: Once | INTRAMUSCULAR | Status: DC | PRN

## 2016-06-05 MED ORDER — SCOPOLAMINE 1 MG/3DAYS TD PT72
MEDICATED_PATCH | TRANSDERMAL | Status: DC | PRN
  Administered 2016-06-05: 1 via TRANSDERMAL

## 2016-06-05 MED ORDER — FENTANYL CITRATE (PF) 100 MCG/2ML IJ SOLN
INTRAMUSCULAR | Status: DC | PRN
  Administered 2016-06-05: 12.5 ug via INTRATHECAL

## 2016-06-05 MED ORDER — MORPHINE SULFATE-NACL 0.5-0.9 MG/ML-% IV SOSY
PREFILLED_SYRINGE | INTRAVENOUS | Status: AC
  Filled 2016-06-05: qty 1

## 2016-06-05 MED ORDER — OXYTOCIN 40 UNITS IN LACTATED RINGERS INFUSION - SIMPLE MED: 2.5000 [IU]/h | INTRAVENOUS | Status: DC

## 2016-06-05 MED ORDER — DEXAMETHASONE SODIUM PHOSPHATE 10 MG/ML IJ SOLN
INTRAMUSCULAR | Status: AC
  Filled 2016-06-05: qty 1

## 2016-06-05 MED ORDER — LACTATED RINGERS IV SOLN: 500.0000 mL | INTRAVENOUS | Status: DC | PRN

## 2016-06-05 MED ORDER — NALOXONE HCL 0.4 MG/ML IJ SOLN: 0.4000 mg | INTRAMUSCULAR | Status: DC | PRN

## 2016-06-05 SURGICAL SUPPLY — 28 items
BENZOIN TINCTURE PRP APPL 2/3 (GAUZE/BANDAGES/DRESSINGS) ×3 IMPLANT
CHLORAPREP W/TINT 26ML (MISCELLANEOUS) ×3 IMPLANT
CLAMP CORD UMBIL (MISCELLANEOUS) ×3 IMPLANT
CLOSURE STERI STRIP 1/2 X4 (GAUZE/BANDAGES/DRESSINGS) ×3 IMPLANT
CLOTH BEACON ORANGE TIMEOUT ST (SAFETY) ×3 IMPLANT
DRSG OPSITE POSTOP 4X10 (GAUZE/BANDAGES/DRESSINGS) ×3 IMPLANT
ELECT REM PT RETURN 9FT ADLT (ELECTROSURGICAL) ×3
ELECTRODE REM PT RTRN 9FT ADLT (ELECTROSURGICAL) ×1 IMPLANT
GLOVE BIOGEL PI IND STRL 7.0 (GLOVE) ×3 IMPLANT
GLOVE BIOGEL PI INDICATOR 7.0 (GLOVE) ×6
GLOVE ECLIPSE 7.0 STRL STRAW (GLOVE) ×3 IMPLANT
GOWN STRL REUS W/TWL LRG LVL3 (GOWN DISPOSABLE) ×6 IMPLANT
NEEDLE HYPO 22GX1.5 SAFETY (NEEDLE) ×3 IMPLANT
NEEDLE HYPO 25X5/8 SAFETYGLIDE (NEEDLE) ×3 IMPLANT
NS IRRIG 1000ML POUR BTL (IV SOLUTION) ×3 IMPLANT
PACK C SECTION WH (CUSTOM PROCEDURE TRAY) ×3 IMPLANT
PAD ABD 7.5X8 STRL (GAUZE/BANDAGES/DRESSINGS) ×3 IMPLANT
PAD ABD DERMACEA PRESS 5X9 (GAUZE/BANDAGES/DRESSINGS) ×3 IMPLANT
PAD OB MATERNITY 4.3X12.25 (PERSONAL CARE ITEMS) ×3 IMPLANT
PENCIL SMOKE EVAC W/HOLSTER (ELECTROSURGICAL) ×3 IMPLANT
SUT PDS AB 0 CTX 36 PDP370T (SUTURE) ×3 IMPLANT
SUT PLAIN 2 0 XLH (SUTURE) IMPLANT
SUT VIC AB 0 CTX 36 (SUTURE) ×6
SUT VIC AB 0 CTX36XBRD ANBCTRL (SUTURE) ×3 IMPLANT
SUT VIC AB 4-0 KS 27 (SUTURE) ×3 IMPLANT
SYR CONTROL 10ML LL (SYRINGE) ×3 IMPLANT
TOWEL OR 17X24 6PK STRL BLUE (TOWEL DISPOSABLE) ×3 IMPLANT
TRAY FOLEY CATH SILVER 14FR (SET/KITS/TRAYS/PACK) ×3 IMPLANT

## 2016-06-05 NOTE — Anesthesia Preprocedure Evaluation (Signed)
Anesthesia Evaluation  Patient identified by MRN, date of birth, ID band Patient awake    Reviewed: Allergy & Precautions, NPO status , Patient's Chart, lab work & pertinent test results  Airway Mallampati: II  TM Distance: >3 FB Neck ROM: Full    Dental no notable dental hx.    Pulmonary neg pulmonary ROS,    Pulmonary exam normal breath sounds clear to auscultation       Cardiovascular negative cardio ROS Normal cardiovascular exam Rhythm:Regular Rate:Normal     Neuro/Psych negative neurological ROS  negative psych ROS   GI/Hepatic negative GI ROS, Neg liver ROS,   Endo/Other  negative endocrine ROS  Renal/GU negative Renal ROS  negative genitourinary   Musculoskeletal negative musculoskeletal ROS (+)   Abdominal   Peds negative pediatric ROS (+)  Hematology negative hematology ROS (+)   Anesthesia Other Findings   Reproductive/Obstetrics (+) Pregnancy                             Anesthesia Physical Anesthesia Plan  ASA: II  Anesthesia Plan: Spinal   Post-op Pain Management:    Induction:   Airway Management Planned: Natural Airway  Additional Equipment:   Intra-op Plan:   Post-operative Plan:   Informed Consent:   Dental advisory given  Plan Discussed with:   Anesthesia Plan Comments:         Anesthesia Quick Evaluation

## 2016-06-05 NOTE — Transfer of Care (Signed)
Immediate Anesthesia Transfer of Care Note  Patient: Katherine Berry  Procedure(s) Performed: Procedure(s): CESAREAN SECTION (N/A)  Patient Location: PACU  Anesthesia Type:Spinal  Level of Consciousness: awake, alert  and oriented  Airway & Oxygen Therapy: Patient Spontanous Breathing  Post-op Assessment: Report given to RN and Post -op Vital signs reviewed and stable  Post vital signs: Reviewed and stable  Last Vitals:  Vitals:   06/05/16 0748 06/05/16 1438  BP: 136/90 117/77  Pulse: (!) 107 (!) 120  Resp: 16   Temp: 36.9 C 36.8 C    Last Pain:  Vitals:   06/05/16 1438  TempSrc: Axillary  PainSc:          Complications: No apparent anesthesia complications

## 2016-06-05 NOTE — Anesthesia Pain Management Evaluation Note (Signed)
  CRNA Pain Management Visit Note  Patient: Katherine Berry, 30 y.o., female  "Hello [facility] am a [facility]. We have an anesthesia team available at all times to provide care throughout the hospital, including epidural management and anesthesia for C-section. I don't know your plan for the delivery whether it a natural birth, water birth, IV sedation, nitrous supplementation, doula or epidural, but we want to meet your pain goals."   1.Was your pain managed to your expectations on prior hospitalizations?   Yes   2.What is your expectation for pain management during this hospitalization?     IV medications  3.How can we help you reach that goal? Nursing support and comfort measures  Record the patient's initial score and the patient's pain goal.   Pain: 5  Pain Goal: 4 [facility] wants you to be able to say your pain was always managed very well.  [facility],Tosca Pletz 06/05/2016

## 2016-06-05 NOTE — Procedures (Signed)
After informed verbal  And written consent, Terbutaline 0.25 mg SQ given, ECV was attempted under Ultrasound guidance.   Bed side Ultrasound confirmed footling breech with the head at maternal right. Three attempts where made for version. All failed. Ultrasound confirmed persistent breech presentation  FHR was reactive before and after the procedure.   Pt. Tolerated the procedure well.  Plans made for PLTCS for breech presentation.   Lorne SkeensNicholas Michael Tyrone Balash, MD 06/05/2016, 9:51 AM

## 2016-06-05 NOTE — H&P (Signed)
LABOR AND DELIVERY ADMISSION HISTORY AND PHYSICAL NOTE  Katherine Berry is a 30 y.o. female G4P0 with IUP at [redacted]w[redacted]d by early ultrasound presenting for version followed by IOL or c-section depending on success.   She reports positive fetal movement. She denies leakage of fluid or vaginal bleeding.  Prenatal History/Complications:  Past Medical History: No past medical history on file.  Past Surgical History: Past Surgical History:  Procedure Laterality Date  . CHOLECYSTECTOMY N/A 05/22/2014   Procedure: LAPAROSCOPIC CHOLECYSTECTOMY;  Surgeon: Abigail Miyamoto, MD;  Location: [facility];  Service: General;  Laterality: N/A;    Obstetrical History: OB History    Gravida Para Term Preterm AB Living   4         3   SAB TAB Ectopic Multiple Live Births           3      Social History: Social History   Social History  . Marital status: Single    Spouse name: N/A  . Number of children: N/A  . Years of education: N/A   Social History Main Topics  . Smoking status: Never Smoker  . Smokeless tobacco: Never Used  . Alcohol use No  . Drug use: No  . Sexual activity: Yes    Partners: Male    Birth control/ protection: IUD   Other Topics Concern  . Not on file   Social History Narrative  . No narrative on file    Family History: No family history on file.  Allergies: No Known Allergies  Prescriptions Prior to Admission  Medication Sig Dispense Refill Last Dose  . acetaminophen (TYLENOL) 325 MG tablet Take 2 tablets (650 mg total) by mouth every 6 (six) hours as needed for mild pain (or Temp > 100).     Marland Kitchen HYDROcodone-acetaminophen (NORCO/VICODIN) 5-325 MG per tablet Take 1-2 tablets by mouth every 4 (four) hours as needed for moderate pain. 40 tablet 0   . ibuprofen (MOTRIN IB) 200 MG tablet You can take 2-3 tablets every 6 hours as needed for pain. 30 tablet 0      Review of Systems   All systems reviewed and negative except as stated in HPI  Last menstrual period  08/23/2015. General appearance: alert, cooperative and appears stated age Lungs: no respiratory distress Heart: regular rate and rhythm Abdomen: soft, non-tender; bowel sounds normal Extremities: No calf swelling or tenderness Presentation: footling breech by ultrasound Fetal monitoring: category 1 Uterine activity: no contraction     Prenatal labs: ABO, Rh:   A+ Antibody:   Neg Rubella:  immune RPR:    Negative HBsAg:   negative HIV:   negative GBS:   GBS positive 1 hr Glucola: 95 Anatomy US: norma  Prenatal Transfer Tool  Maternal Diabetes: No Genetic Screening: Declined Maternal Ultrasounds/Referrals: Normal Fetal Ultrasounds or other Referrals:  None Maternal Substance Abuse:  No Significant Maternal Medications:  None Significant Maternal Lab Results: Lab values include: Group B Strep negative  Results for orders placed or performed during the hospital encounter of 06/05/16 (from the past 24 hour(s))  CBC   Collection Time: 06/05/16  8:00 AM  Result Value Ref Range   WBC 9.3 4.0 - 10.5 K/uL   RBC 4.18 3.87 - 5.11 MIL/uL   Hemoglobin 13.6 12.0 - 15.0 g/dL   HCT 60.4 54.0 - 98.1 %   MCV 91.1 78.0 - 100.0 fL   MCH 32.5 26.0 - 34.0 pg   MCHC 35.7 30.0 - 36.0 g/dL   RDW  13.3 11.5 - 15.5 %   Platelets 252 150 - 400 K/uL    Patient Active Problem List   Diagnosis Date Noted  . Acute calculous cholecystitis 05/21/2014    Assessment: Katherine Berry is a 30 y.o. G4P0 at 6327w0d here for version followed by induction of labor or c-section  #Labor: pending version #Pain: Pending version #FWB: Category 1 #ID:  GBS positive   Ernestina Pennaicholas Quamir Willemsen 06/05/2016, 9:00 AM

## 2016-06-05 NOTE — Anesthesia Postprocedure Evaluation (Signed)
Anesthesia Post Note  Patient: Katherine Berry  Procedure(s) Performed: Procedure(s) (LRB): CESAREAN SECTION (N/A)  Patient location during evaluation: Mother Baby Anesthesia Type: Spinal Level of consciousness: awake and alert and oriented Pain management: satisfactory to patient Vital Signs Assessment: post-procedure vital signs reviewed and stable Respiratory status: respiratory function stable and spontaneous breathing Cardiovascular status: blood pressure returned to baseline Postop Assessment: no headache, no backache, spinal receding, patient able to bend at knees and adequate PO intake Anesthetic complications: no     Last Vitals:  Vitals:   06/05/16 1822 06/05/16 1926  BP: 108/65 (!) 107/52  Pulse: 77 77  Resp: 18 18  Temp: 36.9 C 37 C    Last Pain:  Vitals:   06/05/16 1926  TempSrc: Oral  PainSc:    Pain Goal:                 Loris Winrow

## 2016-06-05 NOTE — Anesthesia Procedure Notes (Signed)
Spinal  Patient location during procedure: OR Staffing Anesthesiologist: Montez Hageman Performed: anesthesiologist  Preanesthetic Checklist Completed: patient identified, site marked, surgical consent, pre-op evaluation, timeout performed, IV checked, risks and benefits discussed and monitors and equipment checked Spinal Block Patient position: sitting Prep: ChloraPrep Patient monitoring: heart rate, continuous pulse ox and blood pressure Approach: midline Location: L3-4 Injection technique: single-shot Needle Needle type: Sprotte  Needle gauge: 24 G Needle length: 9 cm Additional Notes Expiration date of kit checked and confirmed. Patient tolerated procedure well, without complications.

## 2016-06-05 NOTE — Op Note (Signed)
Katherine Berry PROCEDURE DATE: 06/05/2016  PREOPERATIVE DIAGNOSES: Intrauterine pregnancy at 7574w0d weeks gestation; malpresentation: double footling breech; failed external cephalic version  POSTOPERATIVE DIAGNOSES: The same  PROCEDURE: Primary Low Transverse Cesarean Section  SURGEON:  Dr. Jaynie CollinsUgonna Shaton Lore  ASSISTANT:  Dr. Ernestina PennaNicholas Schenk  ANESTHESIOLOGIST: Dr. Laurene FootmanPeter Carriagan  INDICATIONS: Katherine DuraDunia [place]ville is a 30 y.o. [id number] at 4074w0d here for cesarean section secondary to the indications listed under preoperative diagnoses; please see preoperative note for further details.  The risks of cesarean section were discussed with the patient including but were not limited to: bleeding which may require transfusion or reoperation; infection which may require antibiotics; injury to bowel, bladder, ureters or other surrounding organs; injury to the fetus; need for additional procedures including hysterectomy in the event of a life-threatening hemorrhage; placental abnormalities wth subsequent pregnancies, incisional problems, thromboembolic phenomenon and other postoperative/anesthesia complications.   The patient concurred with the proposed plan, giving informed written consent for the procedure.    FINDINGS:  Viable female infant in double footling breech presentation.  Apgars 8 and 10.  Nuchal cord x 1. Clear amniotic fluid.  Intact placenta, three vessel cord.  Normal uterus, fallopian tubes and ovaries bilaterally.  ANESTHESIA: Spinal INTRAVENOUS FLUIDS: 2600 ml ESTIMATED BLOOD LOSS: 1200 ml URINE OUTPUT:  100 ml SPECIMENS: Placenta sent to L&D COMPLICATIONS: None immediate  PROCEDURE IN DETAIL:  The patient preoperatively received intravenous antibiotics and had sequential compression devices applied to her lower extremities.  She was then taken to the operating room where spinal anesthesia was administered and was found to be adequate. She was then placed in a dorsal supine position with a  leftward tilt, and prepped and draped in a sterile manner.  A foley catheter was placed into her bladder and attached to constant gravity.  After an adequate timeout was performed, a Pfannenstiel skin incision was made with scalpel and carried through to the underlying layer of fascia. The fascia was incised in the midline, and this incision was extended bilaterally using the Mayo scissors.  Kocher clamps were applied to the superior aspect of the fascial incision and the underlying rectus muscles were dissected off bluntly.  A similar process was carried out on the inferior aspect of the fascial incision. The rectus muscles were separated in the midline bluntly and the peritoneum was entered bluntly. Attention was turned to the lower uterine segment where a low transverse hysterotomy was made with a scalpel and extended bilaterally bluntly.  The infant was successfully delivered in footling breech presentation, nuchal cord x 1, the cord was clamped and cut , and the infant was handed over to the awaiting neonatology team. Uterine massage was then administered, and the placenta delivered intact with a three-vessel cord. The uterus was then cleared of clots and debris.  The hysterotomy was closed with 0 Vicryl in a running locked fashion, and an imbricating layer was also placed with 0 Vicryl.  The pelvis was cleared of all clot and debris. Hemostasis was confirmed on all surfaces.  The peritoneum was closed with a 0 Vicryl running stitch. The fascia was then closed using 0 Vicryl in a running fashion.  The subcutaneous layer was irrigated, and 30 ml of 0.5% Marcaine was injected subcutaneously around the incision.  The skin was closed with a 4-0 Vicryl subcuticular stitch. The patient tolerated the procedure well. Sponge, lap, instrument and needle counts were correct x 3.  She was taken to the recovery room in stable condition.    Katherine Berry  Katherine Rutherford, MD, [place] Attending [place], [facility]

## 2016-06-06 ENCOUNTER — Encounter (HOSPITAL_COMMUNITY): Payer: Self-pay | Admitting: Obstetrics & Gynecology

## 2016-06-06 LAB — CBC
HCT: 25.7 % — ABNORMAL LOW (ref 36.0–46.0)
Hemoglobin: 9.3 g/dL — ABNORMAL LOW (ref 12.0–15.0)
MCH: 33.1 pg (ref 26.0–34.0)
MCHC: 36.2 g/dL — AB (ref 30.0–36.0)
MCV: 91.5 fL (ref 78.0–100.0)
PLATELETS: 174 10*3/uL (ref 150–400)
RBC: 2.81 MIL/uL — AB (ref 3.87–5.11)
RDW: 13.7 % (ref 11.5–15.5)
WBC: 9 10*3/uL (ref 4.0–10.5)

## 2016-06-06 NOTE — Lactation Note (Signed)
This note was copied from a baby's chart. Lactation Consultation Note  Patient Name: Girl Ames DuraDunia Blea ZOXWR'UToday's Date: 06/06/2016 Reason for consult: Initial assessment   With this experience breastfeeding mom and term baby, now 5223 hours old. Mom said she has some breast tenderness. The baby was latched to right breast and was siting in mom's lap. I used pillows to support mom and baby, and showed mom how to hold baby close for a deep latch, and she said her latch was more comfortable. I reviewed some breastfeeding teaching from baby and me book, and gave mom lactation brochure.    Maternal Data Formula Feeding for Exclusion: Yes Reason for exclusion: Mother's choice to formula and breast feed on admission Has patient been taught Hand Expression?: No Does the patient have breastfeeding experience prior to this delivery?: Yes  Feeding Feeding Type: Breast Fed Length of feed:  (baby was latched when I walked in the room)  LATCH Score/Interventions Latch: Grasps breast easily, tongue down, lips flanged, rhythmical sucking. Intervention(s): Adjust position;Assist with latch  Audible Swallowing: A few with stimulation  Type of Nipple: Everted at rest and after stimulation  Comfort (Breast/Nipple): Soft / non-tender  Problem noted: Cracked, bleeding, blisters, bruises Interventions  (Cracked/bleeding/bruising/blister): Expressed breast milk to nipple (reposition)  Hold (Positioning): Assistance needed to correctly position infant at breast and maintain latch. Intervention(s): Breastfeeding basics reviewed;Support Pillows;Position options  LATCH Score: 8  Lactation Tools Discussed/Used     Consult Status Consult Status: PRN Follow-up type: Call as needed    Alfred LevinsLee, Rotha Cassels Anne 06/06/2016, 2:33 PM

## 2016-06-06 NOTE — Progress Notes (Addendum)
Pt visiting with family. Infant on breast. Pt asking for paci. Informed pt regards to pacifier.  Repositioned pt for comfort and properly latched infant to breast. Breast feeding info provided to pt and family mbr with understanding.   1637: SCD's put back on.

## 2016-06-06 NOTE — Progress Notes (Signed)
UR chart review completed.  

## 2016-06-06 NOTE — Progress Notes (Signed)
POSTPARTUM PROGRESS NOTE  Post Partum Day 1 Subjective:  Katherine Berry is a 30 y.o. [id number] 434w0d s/p PLTCS breech with EBL 1200ml.  No acute events overnight.  Pt denies problems with ambulating, voiding or po intake.  She denies nausea or vomiting.  Pain is well controlled.  She has had flatus. She has not had bowel movement.  Lochia Small.   Objective: Blood pressure (!) 98/50, pulse 79, temperature 98.4 F (36.9 C), temperature source Oral, resp. rate 18, height 5' 2.99" (1.6 m), weight 180 lb (81.6 kg), last menstrual period 08/23/2015, SpO2 96 %, unknown if currently breastfeeding.  Physical Exam:  General: alert, cooperative and no distress Lochia:normal flow Chest: CTAB Heart: RRR no m/r/g Abdomen: +BS, soft, nontender,  Uterine Fundus: firm, NTTP DVT Evaluation: No calf swelling or tenderness Extremities: No palpable edema   Recent Labs  06/05/16 0800 06/06/16 0554  HGB 13.6 9.3*  HCT 38.1 25.7*    Assessment/Plan:  ASSESSMENT: Katherine Berry is a 30 y.o. G4P4004 2434w0d s/p PLTCS for breech with EBL 1200. Hgb 9.8 down from baseline of 13.6  Plan for discharge tomorrow   LOS: 1 day   Deniece ReeJosue D Sharay Bellissimo, MD 06/06/2016, 8:11 AM

## 2016-06-07 LAB — BIRTH TISSUE RECOVERY COLLECTION (PLACENTA DONATION)

## 2016-06-07 MED ORDER — OXYCODONE-ACETAMINOPHEN 5-325 MG PO TABS: 1.0000 | ORAL_TABLET | ORAL | 0 refills | Status: AC | PRN

## 2016-06-07 MED ORDER — FERROUS SULFATE 325 (65 FE) MG PO TABS: 325.0000 mg | ORAL_TABLET | Freq: Two times a day (BID) | ORAL | 1 refills | Status: AC

## 2016-06-07 MED ORDER — IBUPROFEN 600 MG PO TABS: 600.0000 mg | ORAL_TABLET | Freq: Four times a day (QID) | ORAL | 0 refills | Status: AC

## 2016-06-07 NOTE — Discharge Instructions (Signed)
Parto por cesrea - Cuidados posteriores  (Cesarean Delivery, Care After) Siga estas instrucciones durante las prximas semanas. Estas indicaciones le proporcionan informacin general acerca de cmo deber cuidarse despus del procedimiento. El mdico tambin podr darle instrucciones ms especficas. El tratamiento se ha planificado de acuerdo a las prcticas mdicas actuales, pero a veces se producen problemas. Comunquese con el mdico si tiene algn problema o tiene dudas cuando vuelva a su casa.  INSTRUCCIONES PARA EL CUIDADO EN EL HOGAR  Tome slo medicamentos de venta libre o recetados, segn las indicaciones del mdico.  No beba alcohol, especialmente si est amamantando o toma analgsicos.  Nomastique tabaco ni fume.  Contine con un adecuado cuidado perineal. El buen cuidado perineal incluye:  Higienizarse de adelante hacia atrs.  Mantener la zona perineal limpia.  Controlar diariamente el corte (incisin) y observar si aumenta el enrojecimiento, si supura, se hincha o se separa la piel.  Limpie la incisin suavemente con jabn y agua todos los das, y luego squela dando golpecitos. Si el mdico la autoriza, deje la incisin al descubierto. Use un apsito (vendaje) si drena lquido o la incisin parece irritada. Si las pequeas tiras [facility] que cruzan la incisin no se caen dentro de los 7 das, retrelas suavemente.  Abrace una almohada al toser o estornudar hasta que la incisin se cure. Esto ayuda a Best boy.  No conduzca vehculos ni opere maquinarias hasta que el mdico la autorice.  Dchese, lvese el cabello y tome baos de inmersin segn las indicaciones de su mdico.  Utilice un sostn que le ajuste bien y que brinde buen soporte a sus Glass blower/designer.  Limite el uso de bombachas de sostn o medias panty.  Beba suficiente lquido para Consulting civil engineer orina clara o de color amarillo plido.  Consuma todos los das alimentos ricos en fibra como cereales y panes  [place], arroz, frijoles y frutas frescas y verduras. Estos alimentos pueden ayudarla a prevenir o Cytogeneticist.  Reanude las actividades como subir escaleras, conducir automviles, levantar objetos pesados, hacer ejercicios o viajar cuando le indique su mdico.  Hable con su mdico acerca de reanudar la actividad sexual. Volver a la actividad sexual depende del riesgo de infeccin, la velocidad de la curacin y la comodidad y su deseo de Financial controller.  Trate de que alguien la ayude con las actividades del hogar y con el recin nacido al menos durante algunos das despus de salir del hospital.  Descanse todo lo que pueda. Trate de descansar o tomar una siesta mientras el beb est durmiendo.  Aumente sus actividades gradualmente.  Cumpla con todos los controles programados para despus del [place]. Es muy importante asistir a todas las visitas de Nurse, adult. En estas visitas, su mdico va a controlarla para asegurarse de que est sanando fsica y emocionalmente. SOLICITE ATENCIN MDICA SI:   Elimina cogulos grandes por la vagina. Guarde algunos cogulos para mostrarle al mdico.  Tiene una secrecin con feo olor que proviene de la vagina.  Tiene dificultad para orinar.  Orina con frecuencia.  Siente dolor al [employer].  Nota un cambio en sus movimientos intestinales.  Aumenta el enrojecimiento, el dolor o la hinchazn en la zona de la incisin.  Observa que supura pus en la incisin.  La incisin se abre.  Sus [employer] duelen, estn duras o enrojecidas.  Sufre un dolor intenso de [place].  Tiene visin borrosa o ve manchas.  Se siente triste o deprimida.  Tiene pensamientos acerca de lastimarse o daar al  recién nacido. °· Tiene preguntas acerca de su cuidado, la atención del recién nacido o acerca de los medicamentos. °· Se siente mareada o sufre un desmayo. °· Tiene una erupción. °· Siente dolor u observa enrojecimiento o hinchazón en el sitio en que  estaba la vía intravenosa (IV). °· Tiene náuseas o vómitos. °· Usted dejó de amamantar al bebé y no ha tenido su período menstrual dentro de las 12 semanas siguientes. °· No amamanta al bebé y no tuvo su período menstrual en las últimas 12 semanas. °· Tiene fiebre. °SOLICITE ATENCIÓN MÉDICA DE INMEDIATO SI:  °· Siente dolor persistente. °· Siente dolor en el pecho. °· Le falta el aire. °· Se desmaya. °· Siente dolor en la pierna. °· Siente dolor en el estómago. °· El sangrado vaginal satura dos o más apósitos en 1 hora. °ASEGÚRESE DE QUE:  °· Comprende estas instrucciones. °· Controlará su enfermedad. °· Recibirá ayuda de inmediato si no mejora o si empeora. °  °Esta información no tiene como fin reemplazar el consejo del médico. Asegúrese de hacerle al médico cualquier pregunta que tenga. °  °Document Released: 08/06/2005 Document Revised: 08/27/2014 °Elsevier Interactive Patient Education ©2016 Elsevier Inc. ° °

## 2016-06-07 NOTE — Plan of Care (Signed)
Problem: Education: Goal: Knowledge of [place] [facility] Education information/materials will improve Outcome: Completed/Met Date Met: 06/07/16 Utilize spanish interpreter for communication and teaching

## 2016-06-07 NOTE — Discharge Summary (Signed)
OB Discharge Summary     Patient Name: Katherine Berry DOB: July 23, 1986 MRN: [id number]  Date of admission: 06/05/2016 Delivering MD: Jaynie Collins A   Date of discharge: 06/07/2016  Admitting diagnosis: INDUCTION Intrauterine pregnancy: [redacted]w[redacted]d     Secondary diagnosis:  Active Problems:   Breech presentation   S/P cesarean section  Additional problems: none     Discharge diagnosis: Term Pregnancy Delivered                                                                                                Post partum procedures:none  Augmentation: none  Complications: None  Hospital course:  Induction of Labor With Cesarean Section  30 y.o. yo [id number] at [redacted]w[redacted]d was admitted to the hospital 06/05/2016 for induction of labor. Patient had a labor course significant for failed version. The patient went for cesarean section due to Malpresentation, and delivered a Viable infant,@BABYSUPPRESS (DBLINK,ept,110,,1,,) Membrane Rupture Time/Date: )3:12 PM ,06/05/2016   @Details  of operation can be found in separate operative Note.  Patient had an uncomplicated postpartum course. She is ambulating, tolerating a regular diet, passing flatus, and urinating well.  Patient is discharged home in stable condition on 06/07/16.                                     Physical exam  Vitals:   06/06/16 0630 06/06/16 1051 06/06/16 1554 06/07/16 0545  BP: (!) 98/50 (!) 103/50 96/60 (!) 94/56  Pulse: 79 80 90 76  Resp: 18 18 20 19   Temp: 98.4 F (36.9 C) 99.1 F (37.3 C) 98.1 F (36.7 C) 98.3 F (36.8 C)  TempSrc: Oral Oral Oral Oral  SpO2: 96%     Weight:      Height:       General: alert, cooperative and no distress Lochia: appropriate Uterine Fundus: firm Incision: Healing well with no significant drainage DVT Evaluation: No evidence of DVT seen on physical exam. Labs: Lab Results  Component Value Date   WBC 9.0 06/06/2016   HGB 9.3 (L) 06/06/2016   HCT 25.7 (L) 06/06/2016   MCV 91.5  06/06/2016   PLT 174 06/06/2016   CMP Latest Ref Rng & Units 05/22/2014  Glucose 70 - 99 mg/dL 147(W)  BUN 6 - 23 mg/dL 7  Creatinine 2.95 - 6.21 mg/dL 3.08  Sodium 657 - 846 mEq/L 137  Potassium 3.7 - 5.3 mEq/L 3.7  Chloride 96 - 112 mEq/L 103  CO2 19 - 32 mEq/L 26  Calcium 8.4 - 10.5 mg/dL 8.2(L)  Total Protein 6.0 - 8.3 g/dL 6.4  Total Bilirubin 0.3 - 1.2 mg/dL 9.6(E)  Alkaline Phos 39 - 117 U/L 48  AST 0 - 37 U/L 20  ALT 0 - 35 U/L 29    Discharge instruction: per After Visit Summary and "Baby and Me Booklet".  After visit meds:    Medication List    TAKE these medications   ferrous sulfate 325 (65 FE) MG tablet Take 1 tablet (325 mg total) by mouth 2 (  two) times daily with a meal.   ibuprofen 600 MG tablet Commonly known as:  ADVIL,MOTRIN Take 1 tablet (600 mg total) by mouth every 6 (six) hours.   oxyCODONE-acetaminophen 5-325 MG tablet Commonly known as:  PERCOCET/ROXICET Take 1 tablet by mouth every 4 (four) hours as needed (pain scale 4-7).   prenatal multivitamin Tabs tablet Take 1 tablet by mouth daily at 12 noon.       Diet: routine diet  Activity: Advance as tolerated. Pelvic rest for 6 weeks.   Outpatient follow up:2 weeks Follow up Appt:No future appointments. Follow up Visit:No Follow-up on file.  Postpartum contraception: Nexplanon  Newborn Data: Live born female  Birth Weight: 8 lb 11.9 oz (3965 g) APGAR: 8, 10  Baby Feeding: Breast Disposition:home with mother   06/07/2016 Katherine SersAngela C Riccio, DO  CNM attestation I have seen and examined this patient and agree with above documentation in the resident's note.   Katherine Berry is a 30 y.o. [id number] s/p pLTCS.   Pain is well controlled.  Plan for birth control is Nexplanon.  Method of Feeding: breast  PE:  BP (!) 94/56 (BP Location: Left Arm)   Pulse 76   Temp 98.3 F (36.8 C) (Oral)   Resp 19   Ht 5' 2.99" (1.6 m)   Wt 81.6 kg (180 lb)   LMP 08/23/2015   SpO2 96%    Breastfeeding? Unknown   BMI 31.89 kg/m  Fundus firm   Recent Labs  06/05/16 0800 06/06/16 0554  HGB 13.6 9.3*  HCT 38.1 25.7*     Plan: discharge today - postpartum care discussed - f/u clinic in 2 weeks for postpartum visit   Cam HaiSHAW, Katherine Berry, CNM 7:52 AM  06/07/2016

## 2016-06-08 LAB — RPR: RPR: NONREACTIVE

## 2016-06-09 ENCOUNTER — Inpatient Hospital Stay (HOSPITAL_COMMUNITY): Admission: RE | Admit: 2016-06-09 | Payer: Self-pay | Source: Ambulatory Visit

## 2016-06-09 ENCOUNTER — Inpatient Hospital Stay (HOSPITAL_COMMUNITY): Payer: Self-pay
